# Patient Record
Sex: Male | Born: 1980 | Race: Black or African American | Hispanic: No | Marital: Married | Smoking: Current every day smoker
Health system: Southern US, Community
[De-identification: ages and names within clinical notes are randomized; demographics above are authoritative.]

## PROBLEM LIST (undated history)

## (undated) DIAGNOSIS — E119 Type 2 diabetes mellitus without complications: Secondary | ICD-10-CM

---

## 2004-12-13 ENCOUNTER — Emergency Department: Payer: Self-pay | Admitting: Internal Medicine

## 2005-09-24 ENCOUNTER — Emergency Department: Payer: Self-pay | Admitting: Unknown Physician Specialty

## 2007-04-03 ENCOUNTER — Emergency Department: Payer: Self-pay | Admitting: Emergency Medicine

## 2008-09-29 ENCOUNTER — Emergency Department: Payer: Self-pay | Admitting: Emergency Medicine

## 2010-10-11 ENCOUNTER — Emergency Department: Payer: Self-pay | Admitting: Emergency Medicine

## 2010-11-09 ENCOUNTER — Emergency Department: Payer: Self-pay | Admitting: Emergency Medicine

## 2014-01-30 ENCOUNTER — Emergency Department: Payer: Self-pay | Admitting: Emergency Medicine

## 2014-02-01 ENCOUNTER — Emergency Department: Payer: Self-pay | Admitting: Emergency Medicine

## 2015-04-29 ENCOUNTER — Encounter: Payer: Self-pay | Admitting: Emergency Medicine

## 2015-04-29 ENCOUNTER — Emergency Department
Admission: EM | Admit: 2015-04-29 | Discharge: 2015-04-29 | Disposition: A | Discharge status: Home / Self Care | Payer: BLUE CROSS/BLUE SHIELD | Attending: Emergency Medicine | Admitting: Emergency Medicine

## 2015-04-29 DIAGNOSIS — M62838 Other muscle spasm: Secondary | ICD-10-CM | POA: Insufficient documentation

## 2015-04-29 DIAGNOSIS — Y990 Civilian activity done for income or pay: Secondary | ICD-10-CM | POA: Diagnosis not present

## 2015-04-29 DIAGNOSIS — Y93F2 Activity, caregiving, lifting: Secondary | ICD-10-CM | POA: Insufficient documentation

## 2015-04-29 DIAGNOSIS — M5442 Lumbago with sciatica, left side: Secondary | ICD-10-CM | POA: Diagnosis not present

## 2015-04-29 DIAGNOSIS — S3992XA Unspecified injury of lower back, initial encounter: Secondary | ICD-10-CM | POA: Diagnosis present

## 2015-04-29 DIAGNOSIS — X501XXA Overexertion from prolonged static or awkward postures, initial encounter: Secondary | ICD-10-CM | POA: Insufficient documentation

## 2015-04-29 DIAGNOSIS — F17200 Nicotine dependence, unspecified, uncomplicated: Secondary | ICD-10-CM | POA: Insufficient documentation

## 2015-04-29 DIAGNOSIS — M62830 Muscle spasm of back: Secondary | ICD-10-CM

## 2015-04-29 DIAGNOSIS — Y9289 Other specified places as the place of occurrence of the external cause: Secondary | ICD-10-CM | POA: Insufficient documentation

## 2015-04-29 MED ORDER — MELOXICAM 15 MG PO TABS: 15.0000 mg | ORAL_TABLET | Freq: Every day | ORAL | Status: DC

## 2015-04-29 MED ORDER — CYCLOBENZAPRINE HCL 10 MG PO TABS: 10.0000 mg | ORAL_TABLET | Freq: Three times a day (TID) | ORAL | Status: DC | PRN

## 2015-04-29 NOTE — ED Notes (Signed)
Pt states injured low back approx 2 weeks pta. Pt states pain is worse this am, and extends down posterior legs. Pt states has had intermittent sensation of left great toe numbness. Pt ambulatory without difficulty. resps unlabored.

## 2015-04-29 NOTE — ED Provider Notes (Signed)
[facility] Emergency Department Provider Note ?  ? ____________________________________________ ? Time seen: 7:26 AM ? I have reviewed the triage vital signs and the nursing notes.  ________ HISTORY ? Chief Complaint Back Pain     HPI  Bryan Gray is a 35 y.o. male   who presents emergency department complaining of left-sided lower back pain. He states that he was at work approximately 2 weeks ago when he lifted something wrong and developed some lower back pain. He states that over the intervening. The pain has increased to include some radiation down left leg and intermittent left great toe numbness. Patient denies any urinary symptoms or complaints. He denies any saddle anesthesia or bowel or bladder dysfunction. Patient states he is tried some intermittent use of Tylenol with minimal relief. She states that pain is constant but does wax and wane in severity, is a 8/burning sensation. ? ? ? History reviewed. No pertinent past medical history.  There are no active problems to display for this patient.  ? History reviewed. No pertinent past surgical history. ? Current Outpatient Rx  Name  Route  Sig  Dispense  Refill  . cyclobenzaprine (FLEXERIL) 10 MG tablet   Oral   Take 1 tablet (10 mg total) by mouth 3 (three) times daily as needed for muscle spasms.   15 tablet   0   . meloxicam (MOBIC) 15 MG tablet   Oral   Take 1 tablet (15 mg total) by mouth daily.   30 tablet   0    ? Allergies Review of patient's allergies indicates no known allergies. ? No family history on file. ? Social History Social History  Substance Use Topics  . Smoking status: Current Every Day Smoker  . Smokeless tobacco: Never Used  . Alcohol Use: No   ? Review of Systems Constitutional: no fever. Eyes: no discharge ENT: no sore throat. Cardiovascular: no chest pain. Respiratory: no cough. No sob Gastrointestinal: denies abdominal pain, vomiting,  diarrhea, and constipation Genitourinary: no dysuria. Negative for hematuria Musculoskeletal: Positive for back pain.. Skin: Negative for rash. Neurological: Negative for headaches  10-point ROS otherwise negative.  _______________ PHYSICAL EXAM: ? VITAL SIGNS:   ED Triage Vitals  Enc Vitals Group     BP 04/29/15 0633 133/75 mmHg     Pulse Rate 04/29/15 0633 92     Resp 04/29/15 0633 16     Temp 04/29/15 0633 98.1 F (36.7 C)     Temp Source 04/29/15 0633 Oral     SpO2 04/29/15 0633 99 %     Weight 04/29/15 0633 265 lb (120.203 kg)     Height 04/29/15 0633 6' (1.829 m)     Head Cir --      Peak Flow --      Pain Score 04/29/15 0634 8     Pain Loc --      Pain Edu? --      Excl. in [place]? --    ?  Constitutional: Alert and oriented. Well appearing and in no distress. Eyes: Conjunctivae are normal.  ENT      Head: Normocephalic and atraumatic.      Ears:       Nose: No congestion/rhinnorhea.      Mouth/Throat: Mucous membranes are moist.   Hematological/Lymphatic/Immunilogical: No cervical lymphadenopathy. Cardiovascular: Normal rate, regular rhythm. Normal S1 and S2. Respiratory: Normal respiratory effort without tachypnea nor retractions. Lungs CTAB. Gastrointestinal: Soft and nontender. No distention. There is no CVA  tenderness. Genitourinary:  Musculoskeletal: Nontender with normal range of motion in all extremities. No visible deformity to lower spine upon inspection. Patient is nontender to palpation midline spinal processes. Patient is tender to palpation diffusely over the paraspinal muscle groups in the left lumbar and sacral regions. Minor spasms are noted to palpation. Patient is tender to palpation over the sciatic notch. Positive straight leg raise on the left side. Dorsalis pedis pulses palpated bilaterally. Sensation is present and equal in distal lower extremities.  Neurologic:  Normal speech and language. No gross focal neurologic deficits are  appreciated. Skin:  Skin is warm, dry and intact. No rash noted. Psychiatric: Mood and affect are normal. Speech and behavior are normal. Patient exhibits appropriate insight and judgment.    LABS (all labs ordered are listed, but only abnormal results are displayed)  Labs Reviewed - No data to display  ___________ RADIOLOGY    _____________ PROCEDURES ? Procedure(s) performed:    Medications - No data to display  ______________________________________________________ INITIAL IMPRESSION / ASSESSMENT AND PLAN / ED COURSE ? Pertinent labs & imaging results that were available during my care of the patient were reviewed by me and considered in my medical decision making (see chart for details).    Should symptoms are consistent with lumbago with radicular left sided symptoms. Patient will be placed on muscle relaxers and anti-inflammatories for symptom control. The patient will follow-up with orthopedics should symptoms persist past treatment course. Patient will return to the emergency department for any sudden increase of pain or worsening of symptoms.    New Prescriptions   CYCLOBENZAPRINE (FLEXERIL) 10 MG TABLET    Take 1 tablet (10 mg total) by mouth 3 (three) times daily as needed for muscle spasms.   MELOXICAM (MOBIC) 15 MG TABLET    Take 1 tablet (15 mg total) by mouth daily.   ____________________________________________ FINAL CLINICAL IMPRESSION(S) / ED DIAGNOSES?  Final diagnoses:  Acute left-sided low back pain with left-sided sciatica  Paraspinal muscle spasm             Darletta Moll, PA-C 04/29/15 [id number]  Daymon Larsen, MD 04/29/15 [phone]

## 2015-04-29 NOTE — Discharge Instructions (Signed)
Back Exercises The following exercises strengthen the muscles that help to support the back. They also help to keep the lower back flexible. Doing these exercises can help to prevent back pain or lessen existing pain. If you have back pain or discomfort, try doing these exercises 2-3 times each day or as told by your health care provider. When the pain goes away, do them once each day, but increase the number of times that you repeat the steps for each exercise (do more repetitions). If you do not have back pain or discomfort, do these exercises once each day or as told by your health care provider. EXERCISES Single Knee to Chest Repeat these steps 3-5 times for each leg:  Lie on your back on a firm bed or the floor with your legs extended.  Bring one knee to your chest. Your other leg should stay extended and in contact with the floor.  Hold your knee in place by grabbing your knee or thigh.  Pull on your knee until you feel a gentle stretch in your lower back.  Hold the stretch for 10-30 seconds.  Slowly release and straighten your leg. Pelvic Tilt Repeat these steps 5-10 times:  Lie on your back on a firm bed or the floor with your legs extended.  Bend your knees so they are pointing toward the ceiling and your feet are flat on the floor.  Tighten your lower abdominal muscles to press your lower back against the floor. This motion will tilt your pelvis so your tailbone points up toward the ceiling instead of pointing to your feet or the floor.  With gentle tension and even breathing, hold this position for 5-10 seconds. Cat-Cow Repeat these steps until your lower back becomes more flexible:  Get into a hands-and-knees position on a firm surface. Keep your hands under your shoulders, and keep your knees under your hips. You may place padding under your knees for comfort.  Let your head hang down, and point your tailbone toward the floor so your lower back becomes rounded like the  back of a cat.  Hold this position for 5 seconds.  Slowly lift your head and point your tailbone up toward the ceiling so your back forms a sagging arch like the back of a cow.  Hold this position for 5 seconds. Press-Ups Repeat these steps 5-10 times:  Lie on your abdomen (face-down) on the floor.  Place your palms near your head, about shoulder-width apart.  While you keep your back as relaxed as possible and keep your hips on the floor, slowly straighten your arms to raise the top half of your body and lift your shoulders. Do not use your back muscles to raise your upper torso. You may adjust the placement of your hands to make yourself more comfortable.  Hold this position for 5 seconds while you keep your back relaxed.  Slowly return to lying flat on the floor. Bridges Repeat these steps 10 times:  Lie on your back on a firm surface.  Bend your knees so they are pointing toward the ceiling and your feet are flat on the floor.  Tighten your buttocks muscles and lift your buttocks off of the floor until your waist is at almost the same height as your knees. You should feel the muscles working in your buttocks and the back of your thighs. If you do not feel these muscles, slide your feet 1-2 inches farther away from your buttocks.  Hold this position for 3-5  seconds.  Slowly lower your hips to the starting position, and allow your buttocks muscles to relax completely. If this exercise is too easy, try doing it with your arms crossed over your chest. Abdominal Crunches Repeat these steps 5-10 times:  Lie on your back on a firm bed or the floor with your legs extended.  Bend your knees so they are pointing toward the ceiling and your feet are flat on the floor.  Cross your arms over your chest.  Tip your chin slightly toward your chest without bending your neck.  Tighten your abdominal muscles and slowly raise your trunk (torso) high enough to lift your shoulder blades a  tiny bit off of the floor. Avoid raising your torso higher than that, because it can put too much stress on your low back and it does not help to strengthen your abdominal muscles.  Slowly return to your starting position. Back Lifts Repeat these steps 5-10 times: 1. Lie on your abdomen (face-down) with your arms at your sides, and rest your forehead on the floor. 2. Tighten the muscles in your legs and your buttocks. 3. Slowly lift your chest off of the floor while you keep your hips pressed to the floor. Keep the back of your head in line with the curve in your back. Your eyes should be looking at the floor. 4. Hold this position for 3-5 seconds. 5. Slowly return to your starting position. SEEK MEDICAL CARE IF:  Your back pain or discomfort gets much worse when you do an exercise.  Your back pain or discomfort does not lessen within 2 hours after you exercise. If you have any of these problems, stop doing these exercises right away. Do not do them again unless your health care provider says that you can. SEEK IMMEDIATE MEDICAL CARE IF:  You develop sudden, severe back pain. If this happens, stop doing the exercises right away. Do not do them again unless your health care provider says that you can.   This information is not intended to replace advice given to you by your health care provider. Make sure you discuss any questions you have with your health care provider.   Document Released: 05/22/2004 Document Revised: 01/03/2015 Document Reviewed: 06/08/2014 Elsevier Interactive Patient Education 2016 [place] Injury Prevention Back injuries can be very painful. They can also be difficult to heal. After having one back injury, you are more likely to injure your back again. It is important to learn how to avoid injuring or re-injuring your back. The following tips can help you to prevent a back injury. WHAT SHOULD I KNOW ABOUT PHYSICAL FITNESS?  Exercise for 30 minutes per  day on most days of the week or as directed by your health care provider. Make sure to:  Do aerobic exercises, such as walking, jogging, biking, or swimming.  Do exercises that increase balance and strength, such as tai chi and yoga. These can decrease your risk of falling and injuring your back.  Do stretching exercises to help with flexibility.  Try to develop strong abdominal muscles. Your abdominal muscles provide a lot of the support that is needed by your back.  Maintain a healthy weight. This helps to decrease your risk of a back injury. WHAT SHOULD I KNOW ABOUT MY DIET?  Talk with your health care provider about your overall diet. Take supplements and vitamins only as directed by your health care provider.  Talk with your health care provider about how much calcium and  D you need each day. These nutrients help to prevent weakening of the bones (osteoporosis). Osteoporosis can cause broken (fractured) bones, which lead to back pain.  Include good sources of calcium in your diet, such as dairy products, green leafy vegetables, and products that have had calcium added to them (fortified).  Include good sources of vitamin D in your diet, such as milk and foods that are fortified with vitamin D. WHAT SHOULD I KNOW ABOUT MY POSTURE?  Sit up straight and stand up straight. Avoid leaning forward when you sit or hunching over when you stand.  Choose chairs that have good low-back (lumbar) support.  If you work at a desk, sit close to it so you do not need to lean over. Keep your chin tucked in. Keep your neck drawn back, and keep your elbows bent at a right angle. Your arms should look like the letter "L."  Sit high and close to the steering wheel when you drive. Add a lumbar support to your car seat, if needed.  Avoid sitting or standing in one position for very long. Take breaks to get up, stretch, and walk around at least one time every hour. Take breaks every hour if you are  driving for long periods of time.  Sleep on your side with your knees slightly bent, or sleep on your back with a pillow under your knees. Do not lie on the front of your body to sleep. WHAT SHOULD I KNOW ABOUT LIFTING, TWISTING, AND REACHING? Lifting and Heavy Lifting  Avoid heavy lifting, especially repetitive heavy lifting. If you must do heavy lifting:  Stretch before lifting.  Work slowly.  Rest between lifts.  Use a tool such as a cart or a dolly to move objects if one is available.  Make several small trips instead of carrying one heavy load.  Ask for help when you need it, especially when moving big objects.  Follow these steps when lifting:  Stand with your feet shoulder-width apart.  Get as close to the object as you can. Do not try to pick up a heavy object that is far from your body.  Use handles or lifting straps if they are available.  Bend at your knees. Squat down, but keep your heels off the floor.  Keep your shoulders pulled back, your chin tucked in, and your back straight.  Lift the object slowly while you tighten the muscles in your legs, abdomen, and buttocks. Keep the object as close to the center of your body as possible.  Follow these steps when putting down a heavy load:  Stand with your feet shoulder-width apart.  Lower the object slowly while you tighten the muscles in your legs, abdomen, and buttocks. Keep the object as close to the center of your body as possible.  Keep your shoulders pulled back, your chin tucked in, and your back straight.  Bend at your knees. Squat down, but keep your heels off the floor.  Use handles or lifting straps if they are available. Twisting and Reaching  Avoid lifting heavy objects above your waist.  Do not twist at your waist while you are lifting or carrying a load. If you need to turn, move your feet.  Do not bend over without bending at your knees.  Avoid reaching over your head, across a table, or  for an object on a high surface. WHAT ARE SOME OTHER TIPS?  Avoid wet floors and icy ground. Keep sidewalks clear of ice to prevent falls.    falls.  Do not sleep on a mattress that is too soft or too hard.  Keep items that are used frequently within easy reach.  Put heavier objects on shelves at waist level, and put lighter objects on lower or higher shelves.  Find ways to decrease your stress, such as exercise, massage, or relaxation techniques. Stress can build up in your muscles. Tense muscles are more vulnerable to injury.  Talk with your health care provider if you feel anxious or depressed. These conditions can make back pain worse.  Wear flat heel shoes with cushioned soles.  Avoid sudden movements.  Use both shoulder straps when carrying a backpack.  Do not use any tobacco products, including cigarettes, chewing tobacco, or electronic cigarettes. If you need help quitting, ask your health care provider.   This information is not intended to replace advice given to you by your health care provider. Make sure you discuss any questions you have with your health care provider.   Document Released: 05/22/2004 Document Revised: 08/29/2014 Document Reviewed: 04/18/2014 Elsevier Interactive Patient Education 2016 [employer]  Radicular Pain Radicular pain in either the arm or leg is usually from a bulging or herniated disk in the spine. A piece of the herniated disk may press against the nerves as the nerves exit the spine. This causes pain which is felt at the tips of the nerves down the arm or leg. Other causes of radicular pain may include:  Fractures.  Heart disease.  Cancer.  An abnormal and usually degenerative state of the nervous system or nerves (neuropathy). Diagnosis may require CT or MRI scanning to determine the primary cause.  Nerves that start at the neck (nerve roots) may cause radicular pain in the outer shoulder and arm. It can spread down to the thumb and fingers.  The symptoms vary depending on which nerve root has been affected. In most cases radicular pain improves with conservative treatment. Neck problems may require physical therapy, a neck collar, or cervical traction. Treatment may take many weeks, and surgery may be considered if the symptoms do not improve.  Conservative treatment is also recommended for sciatica. Sciatica causes pain to radiate from the lower back or buttock area down the leg into the foot. Often there is a history of back problems. Most patients with sciatica are better after 2 to 4 weeks of rest and other supportive care. Short term bed rest can reduce the disk pressure considerably. Sitting, however, is not a good position since this increases the pressure on the disk. You should avoid bending, lifting, and all other activities which make the problem worse. Traction can be used in severe cases. Surgery is usually reserved for patients who do not improve within the first months of treatment. Only take over-the-counter or prescription medicines for pain, discomfort, or fever as directed by your caregiver. Narcotics and muscle relaxants may help by relieving more severe pain and spasm and by providing mild sedation. Cold or massage can give significant relief. Spinal manipulation is not recommended. It can increase the degree of disc protrusion. Epidural steroid injections are often effective treatment for radicular pain. These injections deliver medicine to the spinal nerve in the space between the protective covering of the spinal cord and back bones (vertebrae). Your caregiver can give you more information about steroid injections. These injections are most effective when given within two weeks of the onset of pain.  You should see your caregiver for follow up care as recommended. A program for neck and  back injury rehabilitation with stretching and strengthening exercises is an important part of management.  SEEK IMMEDIATE MEDICAL CARE  IF:  You develop increased pain, weakness, or numbness in your arm or leg.  You develop difficulty with bladder or bowel control.  You develop abdominal pain.   This information is not intended to replace advice given to you by your health care provider. Make sure you discuss any questions you have with your health care provider.   Document Released: 05/22/2004 Document Revised: 05/05/2014 Document Reviewed: 11/08/2014 Elsevier Interactive Patient Education [employer].

## 2015-04-29 NOTE — ED Notes (Signed)
Back pain started about 2 weeks ago in lower back that radiates down to left leg. No history of pain like this before. Pt states he was at work and picked up something, but pain got worse with time.

## 2015-05-09 ENCOUNTER — Emergency Department (HOSPITAL_COMMUNITY)
Admission: EM | Admit: 2015-05-09 | Discharge: 2015-05-09 | Disposition: A | Discharge status: Home / Self Care | Payer: BLUE CROSS/BLUE SHIELD | Attending: Emergency Medicine | Admitting: Emergency Medicine

## 2015-05-09 ENCOUNTER — Encounter (HOSPITAL_COMMUNITY): Payer: Self-pay | Admitting: *Deleted

## 2015-05-09 DIAGNOSIS — M79605 Pain in left leg: Secondary | ICD-10-CM | POA: Diagnosis present

## 2015-05-09 DIAGNOSIS — R350 Frequency of micturition: Secondary | ICD-10-CM | POA: Diagnosis not present

## 2015-05-09 DIAGNOSIS — F17200 Nicotine dependence, unspecified, uncomplicated: Secondary | ICD-10-CM | POA: Insufficient documentation

## 2015-05-09 DIAGNOSIS — M5442 Lumbago with sciatica, left side: Secondary | ICD-10-CM | POA: Diagnosis not present

## 2015-05-09 DIAGNOSIS — E119 Type 2 diabetes mellitus without complications: Secondary | ICD-10-CM | POA: Diagnosis not present

## 2015-05-09 DIAGNOSIS — Z113 Encounter for screening for infections with a predominantly sexual mode of transmission: Secondary | ICD-10-CM | POA: Insufficient documentation

## 2015-05-09 DIAGNOSIS — Z711 Person with feared health complaint in whom no diagnosis is made: Secondary | ICD-10-CM

## 2015-05-09 DIAGNOSIS — R300 Dysuria: Secondary | ICD-10-CM | POA: Diagnosis not present

## 2015-05-09 HISTORY — DX: Type 2 diabetes mellitus without complications: E11.9

## 2015-05-09 LAB — CBG MONITORING, ED: Glucose-Capillary: 232 mg/dL — ABNORMAL HIGH (ref 65–99)

## 2015-05-09 LAB — URINALYSIS, ROUTINE W REFLEX MICROSCOPIC
Bilirubin Urine: NEGATIVE
Hgb urine dipstick: NEGATIVE
KETONES UR: NEGATIVE mg/dL
Nitrite: NEGATIVE
PH: 6 (ref 5.0–8.0)
Protein, ur: NEGATIVE mg/dL

## 2015-05-09 LAB — URINE MICROSCOPIC-ADD ON

## 2015-05-09 MED ORDER — METHOCARBAMOL 500 MG PO TABS: 500.0000 mg | ORAL_TABLET | Freq: Two times a day (BID) | ORAL | Status: DC | PRN

## 2015-05-09 MED ORDER — AZITHROMYCIN 250 MG PO TABS
1000.0000 mg | ORAL_TABLET | Freq: Once | ORAL | Status: AC
  Administered 2015-05-09: 1000 mg via ORAL
  Filled 2015-05-09: qty 4

## 2015-05-09 MED ORDER — ACETAMINOPHEN 325 MG PO TABS
650.0000 mg | ORAL_TABLET | Freq: Once | ORAL | Status: AC
  Administered 2015-05-09: 650 mg via ORAL
  Filled 2015-05-09: qty 2

## 2015-05-09 MED ORDER — NAPROXEN 250 MG PO TABS: 250.0000 mg | ORAL_TABLET | Freq: Two times a day (BID) | ORAL | Status: DC

## 2015-05-09 MED ORDER — CEFTRIAXONE SODIUM 250 MG IJ SOLR
250.0000 mg | Freq: Once | INTRAMUSCULAR | Status: AC
  Administered 2015-05-09: 250 mg via INTRAMUSCULAR
  Filled 2015-05-09: qty 250

## 2015-05-09 MED ORDER — STERILE WATER FOR INJECTION IJ SOLN
10.0000 mL | Freq: Once | INTRAMUSCULAR | Status: AC
  Administered 2015-05-09: 10 mL via INTRAMUSCULAR
  Filled 2015-05-09: qty 10

## 2015-05-09 NOTE — ED Provider Notes (Signed)
CSN: [id number]     Arrival date & time 05/09/15  1116 History  By signing my name below, I, [name], attest that this documentation has been prepared under the direction and in the presence of Will Kelon Easom, PA-C. Electronically Signed: Judithann Sauger, ED Scribe. 05/09/2015. 2:39 PM.    Chief Complaint  Patient presents with  . Leg Pain   The history is provided by the patient. No language interpreter was used.   HPI Comments: Bryan Gray is a 35 y.o. male with a hx of DM who presents to the Emergency Department complaining gradually worsening 8/10 pain that radiates down his left buttock behind his left leg onset one month ago. He explains that his symptoms started out as back pain after lifting something heavy but that pain has since resolved. He reports associated tingling in toes intermittently. He also reports associated urinary frequency and dysuria onset one week ago. No alleviating factors noted. 8/10 pain. Pt has not taken any medication for his symptoms. He denies any recent falls or trauma. He also denies any IV drug use. Pt reports that he has Metformin at home but has been taking it intermittently as needed.  He denies any fever, chills, leg swelling, gait problems, bladder/bowel incontinence, hematuria, penile discharge, penile/testicular pain, or scrotal swelling.    Past Medical History  Diagnosis Date  . Diabetes mellitus without complication ([place])    History reviewed. No pertinent past surgical history. History reviewed. No pertinent family history. Social History  Substance Use Topics  . Smoking status: Current Every Day Smoker  . Smokeless tobacco: Never Used  . Alcohol Use: No    Review of Systems  Constitutional: Negative for fever and chills.  Respiratory: Negative for cough.   Cardiovascular: Negative for leg swelling.  Gastrointestinal: Negative for vomiting, abdominal pain and diarrhea.  Genitourinary: Positive for dysuria and frequency.  Negative for urgency, hematuria, decreased urine volume, discharge, scrotal swelling, difficulty urinating, penile pain and testicular pain.  Skin: Negative for rash.  Neurological: Negative for weakness, light-headedness, numbness and headaches.      Allergies  Review of patient's allergies indicates no known allergies.  Home Medications   Prior to Admission medications   Medication Sig Start Date End Date Taking? Authorizing Provider  methocarbamol (ROBAXIN) 500 MG tablet Take 1 tablet (500 mg total) by mouth 2 (two) times daily as needed for muscle spasms. 05/09/15   Waynetta Pean, PA-C  naproxen (NAPROSYN) 250 MG tablet Take 1 tablet (250 mg total) by mouth 2 (two) times daily with a meal. 05/09/15   Waynetta Pean, PA-C   BP 117/72 mmHg  Pulse 88  Temp(Src) 98 F (36.7 C) (Oral)  Resp 18  SpO2 99% Physical Exam  Constitutional: He appears well-developed and well-nourished. No distress.  HENT:  Head: Normocephalic and atraumatic.  Eyes: Conjunctivae are normal. Pupils are equal, round, and reactive to light. Right eye exhibits no discharge. Left eye exhibits no discharge.  Neck: Neck supple.  Cardiovascular: Normal rate, regular rhythm, normal heart sounds and intact distal pulses.   Bilateral posterior tibialis and dorsalis pedis pulses are intact.    Pulmonary/Chest: Effort normal and breath sounds normal. No respiratory distress. He has no wheezes. He has no rales.  Abdominal: Soft. Bowel sounds are normal. He exhibits no distension. There is no tenderness. There is no guarding.  No CVA or flank tenderness.   Musculoskeletal: Normal range of motion. He exhibits tenderness. He exhibits no edema.  No midline back tenderness, but  there is tenderness over the left buttock. No edema or crepitus.  No calf edema or tenderness.  No midline neck tenderness. Good strength with plantar and dorsiflexion bilaterally. 5/5 strength bilateral LE.  Normal gait.   Lymphadenopathy:    He  has no cervical adenopathy.  Neurological: He is alert. He has normal reflexes. He displays normal reflexes. Coordination normal.  Normal gait.  Sensation is intact to his bilateral lower extremities. Bilateral patellar DTRs are intact.  Skin: Skin is warm and dry. No rash noted. He is not diaphoretic.  Psychiatric: He has a normal mood and affect. His behavior is normal.  Nursing note and vitals reviewed.   ED Course  Procedures (including critical care time) DIAGNOSTIC STUDIES: Oxygen Saturation is 99% on RA, normal by my interpretation.    COORDINATION OF CARE: 1:26 PM- Pt advised of plan for treatment and pt agrees. Pt will provide urine sample for further evaluation of urinary symptoms. He will receive 650 mg Tylenol.    Labs Review Labs Reviewed  URINALYSIS, ROUTINE W REFLEX MICROSCOPIC (NOT AT [facility]) - Abnormal; Notable for the following:    APPearance CLOUDY (*)    Specific Gravity, Urine >1.046 (*)    Glucose, UA >1000 (*)    Leukocytes, UA SMALL (*)    All other components within normal limits  URINE MICROSCOPIC-ADD ON - Abnormal; Notable for the following:    Squamous Epithelial / LPF 0-5 (*)    Bacteria, UA RARE (*)    All other components within normal limits  CBG MONITORING, ED - Abnormal; Notable for the following:    Glucose-Capillary 232 (*)    All other components within normal limits  URINE CULTURE  RPR  HIV ANTIBODY (ROUTINE TESTING)  GC/CHLAMYDIA PROBE AMP ([place]) NOT AT [facility]    Waynetta Pean, PA-C has personally reviewed and evaluated these lab results as part of his medical decision-making.  MDM   Meds given in ED:  Medications  acetaminophen (TYLENOL) tablet 650 mg (650 mg Oral Given 05/09/15 1349)  cefTRIAXone (ROCEPHIN) injection 250 mg (250 mg Intramuscular Given 05/09/15 1501)  azithromycin (ZITHROMAX) tablet 1,000 mg (1,000 mg Oral Given 05/09/15 1501)  sterile water (preservative free) injection 10 mL (10 mLs Injection Given 05/09/15  1501)    Discharge Medication List as of 05/09/2015  3:23 PM    START taking these medications   Details  methocarbamol (ROBAXIN) 500 MG tablet Take 1 tablet (500 mg total) by mouth 2 (two) times daily as needed for muscle spasms., Starting 05/09/2015, Until Discontinued, Print    naproxen (NAPROSYN) 250 MG tablet Take 1 tablet (250 mg total) by mouth 2 (two) times daily with a meal., Starting 05/09/2015, Until Discontinued, Print        Final diagnoses:  Left-sided low back pain with left-sided sciatica  Concern about STD in male without diagnosis   This is a 35 y.o. male with a hx of DM who presents to the Emergency Department complaining gradually worsening 8/10 pain that radiates down his left buttock behind his left leg onset one month ago. He explains that his symptoms started out as back pain after lifting something heavy but that pain has since resolved. He reports associated tingling in toes intermittently. He also reports associated urinary frequency and dysuria onset one week ago. No alleviating factors noted.  On exam patient is afebrile nontoxic appearing. His abdomen is soft nontender palpation. Is no CVA or flank tenderness. He has no midline neck or back tenderness.  He has some mild tenderness to his left lower back and upper buttocks. He has no focal neurological deficits. No concern for cauda equina.  Urinalysis is nitrate negative and shows small leukocytes. Has 0-5 white blood cells. Rare bacteria. Urine sent for culture. I doubt urinary tract infection but I question STD. Patient also has greater than 1000 glucose in his urine. On further question patient admits that he has diabetes and intermittently takes metformin. He reports his plenty of metformin at home but has been forgetting to take it. Finger stick glucose is 232. I advised the patient of these findings. He also notes being sexually active and denies and protection. I'm concerned [place]. Will treat with Rocephin and  azithromycin and check for STDs. I advised the patient that his RPR, HIV, gonorrhea and chlamydia testing are in process and he should follow-up with his test results. We'll discharge a prescription for naproxen and Robaxin. Advised back exercises to do for his back pain. I doubt his dysuria is related to his back pain. I advised the patient to follow-up with their primary care provider this week. I advised the patient to return to the emergency department with new or worsening symptoms or new concerns. The patient verbalized understanding and agreement with plan.    This patient was discussed with Dr. Reather Converse who agrees with assessment and plan.   I personally performed the services described in this documentation, which was scribed in my presence. The recorded information has been reviewed and is accurate.      Waynetta Pean, PA-C 05/09/15 [place], MD 05/12/15 [phone]

## 2015-05-09 NOTE — ED Notes (Signed)
Declined W/C at D/C and was escorted to lobby by RN. 

## 2015-05-09 NOTE — ED Notes (Signed)
eval for elevated BS

## 2015-05-09 NOTE — Discharge Instructions (Signed)
Back Exercises The following exercises strengthen the muscles that help to support the back. They also help to keep the lower back flexible. Doing these exercises can help to prevent back pain or lessen existing pain. If you have back pain or discomfort, try doing these exercises 2-3 times each day or as told by your health care provider. When the pain goes away, do them once each day, but increase the number of times that you repeat the steps for each exercise (do more repetitions). If you do not have back pain or discomfort, do these exercises once each day or as told by your health care provider. EXERCISES Single Knee to Chest Repeat these steps 3-5 times for each leg:  Lie on your back on a firm bed or the floor with your legs extended.  Bring one knee to your chest. Your other leg should stay extended and in contact with the floor.  Hold your knee in place by grabbing your knee or thigh.  Pull on your knee until you feel a gentle stretch in your lower back.  Hold the stretch for 10-30 seconds.  Slowly release and straighten your leg. Pelvic Tilt Repeat these steps 5-10 times:  Lie on your back on a firm bed or the floor with your legs extended.  Bend your knees so they are pointing toward the ceiling and your feet are flat on the floor.  Tighten your lower abdominal muscles to press your lower back against the floor. This motion will tilt your pelvis so your tailbone points up toward the ceiling instead of pointing to your feet or the floor.  With gentle tension and even breathing, hold this position for 5-10 seconds. Cat-Cow Repeat these steps until your lower back becomes more flexible:  Get into a hands-and-knees position on a firm surface. Keep your hands under your shoulders, and keep your knees under your hips. You may place padding under your knees for comfort.  Let your head hang down, and point your tailbone toward the floor so your lower back becomes rounded like the  back of a cat.  Hold this position for 5 seconds.  Slowly lift your head and point your tailbone up toward the ceiling so your back forms a sagging arch like the back of a cow.  Hold this position for 5 seconds. Press-Ups Repeat these steps 5-10 times:  Lie on your abdomen (face-down) on the floor.  Place your palms near your head, about shoulder-width apart.  While you keep your back as relaxed as possible and keep your hips on the floor, slowly straighten your arms to raise the top half of your body and lift your shoulders. Do not use your back muscles to raise your upper torso. You may adjust the placement of your hands to make yourself more comfortable.  Hold this position for 5 seconds while you keep your back relaxed.  Slowly return to lying flat on the floor. Bridges Repeat these steps 10 times:  Lie on your back on a firm surface.  Bend your knees so they are pointing toward the ceiling and your feet are flat on the floor.  Tighten your buttocks muscles and lift your buttocks off of the floor until your waist is at almost the same height as your knees. You should feel the muscles working in your buttocks and the back of your thighs. If you do not feel these muscles, slide your feet 1-2 inches farther away from your buttocks.  Hold this position for 3-5  seconds.  Slowly lower your hips to the starting position, and allow your buttocks muscles to relax completely. If this exercise is too easy, try doing it with your arms crossed over your chest. Abdominal Crunches Repeat these steps 5-10 times:  Lie on your back on a firm bed or the floor with your legs extended.  Bend your knees so they are pointing toward the ceiling and your feet are flat on the floor.  Cross your arms over your chest.  Tip your chin slightly toward your chest without bending your neck.  Tighten your abdominal muscles and slowly raise your trunk (torso) high enough to lift your shoulder blades a  tiny bit off of the floor. Avoid raising your torso higher than that, because it can put too much stress on your low back and it does not help to strengthen your abdominal muscles.  Slowly return to your starting position. Back Lifts Repeat these steps 5-10 times:  Lie on your abdomen (face-down) with your arms at your sides, and rest your forehead on the floor.  Tighten the muscles in your legs and your buttocks.  Slowly lift your chest off of the floor while you keep your hips pressed to the floor. Keep the back of your head in line with the curve in your back. Your eyes should be looking at the floor.  Hold this position for 3-5 seconds.  Slowly return to your starting position. SEEK MEDICAL CARE IF:  Your back pain or discomfort gets much worse when you do an exercise.  Your back pain or discomfort does not lessen within 2 hours after you exercise. If you have any of these problems, stop doing these exercises right away. Do not do them again unless your health care provider says that you can. SEEK IMMEDIATE MEDICAL CARE IF:  You develop sudden, severe back pain. If this happens, stop doing the exercises right away. Do not do them again unless your health care provider says that you can.   This information is not intended to replace advice given to you by your health care provider. Make sure you discuss any questions you have with your health care provider.   Document Released: 05/22/2004 Document Revised: 01/03/2015 Document Reviewed: 06/08/2014 Elsevier Interactive Patient Education 2016 Elsevier Inc[employer] Back Pain, Adult Back pain is very common in adults.The cause of back pain is rarely dangerous and the pain often gets better over time.The cause of your back pain may not be known. Some common causes of back pain include:  Strain of the muscles or ligaments supporting the spine.  Wear and tear (degeneration) of the spinal disks.  Arthritis.  Direct injury to the back. For  many people, back pain may return. Since back pain is rarely dangerous, most people can learn to manage this condition on their own. HOME CARE INSTRUCTIONS Watch your back pain for any changes. The following actions may help to lessen any discomfort you are feeling:  Remain active. It is stressful on your back to sit or stand in one place for long periods of time. Do not sit, drive, or stand in one place for more than 30 minutes at a time. Take short walks on even surfaces as soon as you are able.Try to increase the length of time you walk each day.  Exercise regularly as directed by your health care provider. Exercise helps your back heal faster. It also helps avoid future injury by keeping your muscles strong and flexible.  Do not stay in bed.Resting  more than 1-2 days can delay your recovery.  Pay attention to your body when you bend and lift. The most comfortable positions are those that put less stress on your recovering back. Always use proper lifting techniques, including:  Bending your knees.  Keeping the load close to your body.  Avoiding twisting.  Find a comfortable position to sleep. Use a firm mattress and lie on your side with your knees slightly bent. If you lie on your back, put a pillow under your knees.  Avoid feeling anxious or stressed.Stress increases muscle tension and can worsen back pain.It is important to recognize when you are anxious or stressed and learn ways to manage it, such as with exercise.  Take medicines only as directed by your health care provider. Over-the-counter medicines to reduce pain and inflammation are often the most helpful.Your health care provider may prescribe muscle relaxant drugs.These medicines help dull your pain so you can more quickly return to your normal activities and healthy exercise.  Apply ice to the injured area:  Put ice in a plastic bag.  Place a towel between your skin and the bag.  Leave the ice on for 20 minutes,  2-3 times a day for the first 2-3 days. After that, ice and heat may be alternated to reduce pain and spasms.  Maintain a healthy weight. Excess weight puts extra stress on your back and makes it difficult to maintain good posture. SEEK MEDICAL CARE IF:  You have pain that is not relieved with rest or medicine.  You have increasing pain going down into the legs or buttocks.  You have pain that does not improve in one week.  You have night pain.  You lose weight.  You have a fever or chills. SEEK IMMEDIATE MEDICAL CARE IF:   You develop new bowel or bladder control problems.  You have unusual weakness or numbness in your arms or legs.  You develop nausea or vomiting.  You develop abdominal pain.  You feel faint.   This information is not intended to replace advice given to you by your health care provider. Make sure you discuss any questions you have with your health care provider.   Document Released: 04/14/2005 Document Revised: 05/05/2014 Document Reviewed: 08/16/2013 Elsevier Interactive Patient Education 2016 [employer]. Sexually Transmitted Disease A sexually transmitted disease (STD) is a disease or infection that may be passed (transmitted) from person to person, usually during sexual activity. This may happen by way of saliva, semen, blood, vaginal mucus, or urine. Common STDs include:  Gonorrhea.  Chlamydia.  Syphilis.  HIV and AIDS.  Genital herpes.  Hepatitis B and C.  Trichomonas.  Human papillomavirus (HPV).  Pubic lice.  Scabies.  Mites.  Bacterial vaginosis. WHAT ARE CAUSES OF STDs? An STD may be caused by bacteria, a virus, or parasites. STDs are often transmitted during sexual activity if one person is infected. However, they may also be transmitted through nonsexual means. STDs may be transmitted after:   Sexual intercourse with an infected person.  Sharing sex toys with an infected person.  Sharing needles with an infected person  or using unclean piercing or tattoo needles.  Having intimate contact with the genitals, mouth, or rectal areas of an infected person.  Exposure to infected fluids during birth. WHAT ARE THE SIGNS AND SYMPTOMS OF STDs? Different STDs have different symptoms. Some people may not have any symptoms. If symptoms are present, they may include:  Painful or bloody urination.  Pain in the pelvis,  abdomen, vagina, anus, throat, or eyes.  A skin rash, itching, or irritation.  Growths, ulcerations, blisters, or sores in the genital and anal areas.  Abnormal vaginal discharge with or without bad odor.  Penile discharge in men.  Fever.  Pain or bleeding during sexual intercourse.  Swollen glands in the groin area.  Yellow skin and eyes (jaundice). This is seen with hepatitis.  Swollen testicles.  Infertility.  Sores and blisters in the mouth. HOW ARE STDs DIAGNOSED? To make a diagnosis, your health care provider may:  Take a medical history.  Perform a physical exam.  Take a sample of any discharge to examine.  Swab the throat, cervix, opening to the penis, rectum, or vagina for testing.  Test a sample of your first morning urine.  Perform blood tests.  Perform a Pap test, if this applies.  Perform a colposcopy.  Perform a laparoscopy. HOW ARE STDs TREATED? Treatment depends on the STD. Some STDs may be treated but not cured.  Chlamydia, gonorrhea, trichomonas, and syphilis can be cured with antibiotic medicine.  Genital herpes, hepatitis, and HIV can be treated, but not cured, with prescribed medicines. The medicines lessen symptoms.  Genital warts from HPV can be treated with medicine or by freezing, burning (electrocautery), or surgery. Warts may come back.  HPV cannot be cured with medicine or surgery. However, abnormal areas may be removed from the cervix, vagina, or vulva.  If your diagnosis is confirmed, your recent sexual partners need treatment. This is  true even if they are symptom-free or have a negative culture or evaluation. They should not have sex until their health care providers say it is okay.  Your health care provider may test you for infection again 3 months after treatment. HOW CAN I REDUCE MY RISK OF GETTING AN STD? Take these steps to reduce your risk of getting an STD:  Use latex condoms, dental dams, and water-soluble lubricants during sexual activity. Do not use petroleum jelly or oils.  Avoid having multiple sex partners.  Do not have sex with someone who has other sex partners  Do not have sex with anyone you do not know or who is at high risk for an STD.  Avoid risky sex practices that can break your skin.  Do not have sex if you have open sores on your mouth or skin.  Avoid drinking too much alcohol or taking illegal drugs. Alcohol and drugs can affect your judgment and put you in a vulnerable position.  Avoid engaging in oral and anal sex acts.  Get vaccinated for HPV and hepatitis. If you have not received these vaccines in the past, talk to your health care provider about whether one or both might be right for you.  If you are at risk of being infected with HIV, it is recommended that you take a prescription medicine daily to prevent HIV infection. This is called pre-exposure prophylaxis (PrEP). You are considered at risk if:  You are a man who has sex with other men (MSM).  You are a heterosexual man or woman and are sexually active with more than one partner.  You take drugs by injection.  You are sexually active with a partner who has HIV.  Talk with your health care provider about whether you are at high risk of being infected with HIV. If you choose to begin PrEP, you should first be tested for HIV. You should then be tested every 3 months for as long as you are taking PrEP.  WHAT SHOULD I DO IF I THINK I HAVE AN STD?  See your health care provider.  Tell your sexual partner(s). They should be  tested and treated for any STDs.  Do not have sex until your health care provider says it is okay. WHEN SHOULD I GET IMMEDIATE MEDICAL CARE? Contact your health care provider right away if:   You have severe abdominal pain.  You are a man and notice swelling or pain in your testicles.  You are a woman and notice swelling or pain in your vagina.   This information is not intended to replace advice given to you by your health care provider. Make sure you discuss any questions you have with your health care provider.   Document Released: 07/05/2002 Document Revised: 05/05/2014 Document Reviewed: 11/02/2012 Elsevier Interactive Patient Education [employer].

## 2015-05-09 NOTE — ED Notes (Signed)
SEE PA assessment

## 2015-05-09 NOTE — ED Notes (Signed)
Pt states that he was seen recently for back pain. Pt states that pain in the back has improved however his hip continues to hurt.

## 2015-05-10 LAB — HIV ANTIBODY (ROUTINE TESTING W REFLEX): HIV Screen 4th Generation wRfx: NONREACTIVE

## 2015-05-10 LAB — URINE CULTURE

## 2015-05-10 LAB — RPR: RPR: NONREACTIVE

## 2015-05-10 LAB — GC/CHLAMYDIA PROBE AMP (~~LOC~~) NOT AT ARMC
Chlamydia: NEGATIVE
NEISSERIA GONORRHEA: NEGATIVE

## 2015-09-14 ENCOUNTER — Emergency Department
Admission: EM | Admit: 2015-09-14 | Discharge: 2015-09-14 | Disposition: A | Discharge status: Home / Self Care | Payer: BLUE CROSS/BLUE SHIELD | Attending: Emergency Medicine | Admitting: Emergency Medicine

## 2015-09-14 DIAGNOSIS — F17200 Nicotine dependence, unspecified, uncomplicated: Secondary | ICD-10-CM | POA: Insufficient documentation

## 2015-09-14 DIAGNOSIS — R739 Hyperglycemia, unspecified: Secondary | ICD-10-CM

## 2015-09-14 DIAGNOSIS — Z791 Long term (current) use of non-steroidal anti-inflammatories (NSAID): Secondary | ICD-10-CM | POA: Insufficient documentation

## 2015-09-14 DIAGNOSIS — L03011 Cellulitis of right finger: Secondary | ICD-10-CM | POA: Insufficient documentation

## 2015-09-14 DIAGNOSIS — E1165 Type 2 diabetes mellitus with hyperglycemia: Secondary | ICD-10-CM | POA: Insufficient documentation

## 2015-09-14 DIAGNOSIS — M79644 Pain in right finger(s): Secondary | ICD-10-CM | POA: Diagnosis present

## 2015-09-14 LAB — CBC WITH DIFFERENTIAL/PLATELET
Basophils Absolute: 0 10*3/uL (ref 0–0.1)
Eosinophils Absolute: 0.2 10*3/uL (ref 0–0.7)
Eosinophils Relative: 2 %
HEMATOCRIT: 45.3 % (ref 40.0–52.0)
HEMOGLOBIN: 15.2 g/dL (ref 13.0–18.0)
Lymphs Abs: 2.5 10*3/uL (ref 1.0–3.6)
MCH: 32.2 pg (ref 26.0–34.0)
MCHC: 33.5 g/dL (ref 32.0–36.0)
MCV: 96.1 fL (ref 80.0–100.0)
MONO ABS: 0.8 10*3/uL (ref 0.2–1.0)
NEUTROS ABS: 5.3 10*3/uL (ref 1.4–6.5)
Platelets: 189 10*3/uL (ref 150–440)
RBC: 4.71 MIL/uL (ref 4.40–5.90)
RDW: 12.2 % (ref 11.5–14.5)
WBC: 8.9 10*3/uL (ref 3.8–10.6)

## 2015-09-14 LAB — GLUCOSE, CAPILLARY: Glucose-Capillary: 428 mg/dL — ABNORMAL HIGH (ref 65–99)

## 2015-09-14 LAB — COMPREHENSIVE METABOLIC PANEL
ALBUMIN: 4 g/dL (ref 3.5–5.0)
ALK PHOS: 92 U/L (ref 38–126)
ALT: 22 U/L (ref 17–63)
AST: 20 U/L (ref 15–41)
Anion gap: 9 (ref 5–15)
BILIRUBIN TOTAL: 0.8 mg/dL (ref 0.3–1.2)
BUN: 9 mg/dL (ref 6–20)
CALCIUM: 8.7 mg/dL — AB (ref 8.9–10.3)
CO2: 22 mmol/L (ref 22–32)
CREATININE: 0.94 mg/dL (ref 0.61–1.24)
Chloride: 104 mmol/L (ref 101–111)
GFR calc Af Amer: >60 mL/min (ref >60)
GLUCOSE: 426 mg/dL — AB (ref 65–99)
POTASSIUM: 4 mmol/L (ref 3.5–5.1)
Sodium: 135 mmol/L (ref 135–145)
TOTAL PROTEIN: 7.2 g/dL (ref 6.5–8.1)

## 2015-09-14 MED ORDER — METFORMIN HCL 500 MG PO TABS: 500.0000 mg | ORAL_TABLET | Freq: Two times a day (BID) | ORAL | Status: DC

## 2015-09-14 MED ORDER — MUPIROCIN 2 % EX OINT: TOPICAL_OINTMENT | CUTANEOUS | Status: AC

## 2015-09-14 NOTE — ED Notes (Addendum)
PT c/o of pain/swelling of 5th finger on right hand at the nailbed. Pain radiated down hand and to fourth finger. Pt reports hx of Type II diabetes. Pt takes Metformin

## 2015-09-14 NOTE — ED Notes (Signed)
Pt POCT CBG 428. Pt reports he has not taken his Metformin since Tuesday morning

## 2015-09-14 NOTE — ED Notes (Signed)
Unable to obtain signature, due to signature pad not working. Pt given discharge instructions. Verbalized understanding.

## 2015-09-14 NOTE — ED Provider Notes (Signed)
[facility] Emergency Department Provider Note        Time seen: ----------------------------------------- 7:22 AM on 09/14/2015 -----------------------------------------    I have reviewed the triage vital signs and the nursing notes.   HISTORY  Chief Complaint Wound Infection    HPI Bryan Gray is a 35 y.o. male who presents to ER for pain and swelling around the nail of the fifth digit on the right hand. Patient states the pain radiated down his hand. He reports a history of diabetes but has not been taking his metformin recently. He does bite his fingernails, is an ingrown nail recently in that same finger   Past Medical History  Diagnosis Date  . Diabetes mellitus without complication ([place])     There are no active problems to display for this patient.   History reviewed. No pertinent past surgical history.  Allergies Review of patient's allergies indicates no known allergies.  Social History Social History  Substance Use Topics  . Smoking status: Current Every Day Smoker  . Smokeless tobacco: Never Used  . Alcohol Use: No    Review of Systems Constitutional: Negative for fever. Musculoskeletal: Positive for pain in the right fifth digit Skin: Positive for swelling and fluctuance noted around the right fifth digit distally Neurological: Negative for focal weakness or numbness.  ____________________________________________   PHYSICAL EXAM:  VITAL SIGNS: ED Triage Vitals  Enc Vitals Group     BP 09/14/15 0602 128/78 mmHg     Pulse Rate 09/14/15 0602 91     Resp 09/14/15 0602 18     Temp 09/14/15 0602 98.2 F (36.8 C)     Temp Source 09/14/15 0602 Oral     SpO2 09/14/15 0602 98 %     Weight 09/14/15 0602 255 lb (115.667 kg)     Height 09/14/15 0602 6' (1.829 m)     Head Cir --      Peak Flow --      Pain Score 09/14/15 0603 8     Pain Loc --      Pain Edu? --      Excl. in [place]? --    Constitutional: Alert and  oriented. Well appearing and in no distress. Musculoskeletal: Moderate, diffuse paronychia is noted around the nailbed of the right fifth digit. Neurologic:  Normal speech and language. No gross focal neurologic deficits are appreciated.  Skin:  Fluctuance and erythema is noted around the right fifth digit nail bed  Psychiatric: Mood and affect are normal. Speech and behavior are normal.   ____________________________________________  ED COURSE:  Pertinent labs & imaging results that were available during my care of the patient were reviewed by me and considered in my medical decision making (see chart for details). Patient will require incision and drainage of the paronychia. INCISION AND DRAINAGE Performed by: Lenise Arena E Consent: Verbal consent obtained. Risks and benefits: risks, benefits and alternatives were discussed Type: abscess  Body area: Right fifth digit, hand  Anesthesia: Digital block  Incision was made with a scalpel.  Local anesthetic: lidocaine 1 % with epinephrine  Anesthetic total: 3 ml  Complexity: complex Blunt dissection to break up loculations  Drainage: purulent  Drainage amount: Moderate   Packing material: None   Patient tolerance: Patient tolerated the procedure well with no immediate complications.   ____________________________________________    LABS (pertinent positives/negatives)  Labs Reviewed  GLUCOSE, CAPILLARY - Abnormal; Notable for the following:    Glucose-Capillary 428 (*)    All  other components within normal limits  COMPREHENSIVE METABOLIC PANEL - Abnormal; Notable for the following:    Glucose, Bld 426 (*)    Calcium 8.7 (*)    All other components within normal limits  CBC WITH DIFFERENTIAL/PLATELET   ____________________________________________  FINAL ASSESSMENT AND PLAN  Paronychia  Plan: Patient with labs as dictated above. Patient states she has not taken his metformin in several days and normally  this controls his blood sugars very well. I'll advise topical antibiotic ointment for his finger, I will refill his metformin prescription. He is stable for discharge.   Earleen Newport, MD   Note: This dictation was prepared with Dragon dictation. Any transcriptional errors that result from this process are unintentional   Earleen Newport, MD 09/14/15 [phone]

## 2015-09-14 NOTE — ED Notes (Signed)
Pt's CBG reported to MD Owens Shark

## 2015-09-14 NOTE — ED Notes (Signed)
Patient presents with infection of right pinky finger. States he had an ingrown nail he pulled out about two days ago. Today presents with pain and swelling around the nail on that finger.

## 2015-09-14 NOTE — Discharge Instructions (Signed)
Paronychia Paronychia is an infection of the skin that surrounds a nail. It usually affects the skin around a fingernail, but it may also occur near a toenail. It often causes pain and swelling around the nail. This condition may come on suddenly or develop over a longer period. In some cases, a collection of pus (abscess) can form near or under the nail. Usually, paronychia is not serious and it clears up with treatment. CAUSES This condition may be caused by bacteria or fungi. It is commonly caused by either Streptococcus or Staphylococcus bacteria. The bacteria or fungi often cause the infection by getting into the affected area through an opening in the skin, such as a cut or a hangnail. RISK FACTORS This condition is more likely to develop in:  People who get their hands wet often, such as those who work as Designer, industrial/product, bartenders, or nurses.  People who bite their fingernails or suck their thumbs.  People who trim their nails too short.  People who have hangnails or injured fingertips.  People who get manicures.  People who have diabetes. SYMPTOMS Symptoms of this condition include:  Redness and swelling of the skin near the nail.  Tenderness around the nail when you touch the area.  Pus-filled bumps under the cuticle. The cuticle is the skin at the base or sides of the nail.  Fluid or pus under the nail.  Throbbing pain in the area. DIAGNOSIS This condition is usually diagnosed with a physical exam. In some cases, a sample of pus may be taken from an abscess to be tested in a lab. This can help to determine what type of bacteria or fungi is causing the condition. TREATMENT Treatment for this condition depends on the cause and severity of the condition. If the condition is mild, it may clear up on its own in a few days. Your health care provider may recommend soaking the affected area in warm water a few times a day. When treatment is needed, the options may  include:  Antibiotic medicine, if the condition is caused by a bacterial infection.  Antifungal medicine, if the condition is caused by a fungal infection.  Incision and drainage, if an abscess is present. In this procedure, the health care provider will cut open the abscess so the pus can drain out. HOME CARE INSTRUCTIONS  Soak the affected area in warm water if directed to do so by your health care provider. You may be told to do this for 20 minutes, 2-3 times a day. Keep the area dry in between soakings.  Take medicines only as directed by your health care provider.  If you were prescribed an antibiotic medicine, finish all of it even if you start to feel better.  Keep the affected area clean.  Do not try to drain a fluid-filled bump yourself.  If you will be washing dishes or performing other tasks that require your hands to get wet, wear rubber gloves. You should also wear gloves if your hands might come in contact with irritating substances, such as cleaners or chemicals.  Follow your health care provider's instructions about:  Wound care.  Bandage (dressing) changes and removal. SEEK MEDICAL CARE IF:  Your symptoms get worse or do not improve with treatment.  You have a fever or chills.  You have redness spreading from the affected area.  You have continued or increased fluid, blood, or pus coming from the affected area.  Your finger or knuckle becomes swollen or is difficult to move.  This information is not intended to replace advice given to you by your health care provider. Make sure you discuss any questions you have with your health care provider.   Document Released: 10/08/2000 Document Revised: 08/29/2014 Document Reviewed: 03/22/2014 Elsevier Interactive Patient Education 2016 Elsevier Inc[employer]  Hyperglycemia High blood sugar (hyperglycemia) means that the level of sugar in your blood is higher than it should be. Signs of high blood sugar include:  Feeling  thirsty.  Frequent peeing (urinating).  Feeling tired or sleepy.  Dry mouth.  Vision changes.  Feeling weak.  Feeling hungry but losing weight.  Numbness and tingling in your hands or feet.  Headache. When you ignore these signs, your blood sugar may keep going up. These problems may get worse, and other problems may begin. HOME CARE  Check your blood sugars as told by your doctor. Write down the numbers with the date and time.  Take the right amount of insulin or diabetes pills at the right time. Write down the dose with date and time.  Refill your insulin or diabetes pills before running out.  Watch what you eat. Follow your meal plan.  Drink liquids without sugar, such as water. Check with your doctor if you have kidney or heart disease.  Follow your doctor's orders for exercise. Exercise at the same time of day.  Keep your doctor's appointments. GET HELP RIGHT AWAY IF:   You have trouble thinking or are confused.  You have fast breathing with fruity smelling breath.  You pass out (faint).  You have 2 to 3 days of high blood sugars and you do not know why.  You have chest pain.  You are feeling sick to your stomach (nauseous) or throwing up (vomiting).  You have sudden vision changes. MAKE SURE YOU:   Understand these instructions.  Will watch your condition.  Will get help right away if you are not doing well or get worse.   This information is not intended to replace advice given to you by your health care provider. Make sure you discuss any questions you have with your health care provider.   Document Released: 02/09/2009 Document Revised: 05/05/2014 Document Reviewed: 12/19/2014 Elsevier Interactive Patient Education [employer].

## 2015-11-07 ENCOUNTER — Emergency Department (HOSPITAL_COMMUNITY)
Admission: EM | Admit: 2015-11-07 | Discharge: 2015-11-07 | Disposition: A | Discharge status: Home / Self Care | Payer: BLUE CROSS/BLUE SHIELD | Attending: Emergency Medicine | Admitting: Emergency Medicine

## 2015-11-07 ENCOUNTER — Encounter (HOSPITAL_COMMUNITY): Payer: Self-pay | Admitting: Emergency Medicine

## 2015-11-07 DIAGNOSIS — Y929 Unspecified place or not applicable: Secondary | ICD-10-CM | POA: Insufficient documentation

## 2015-11-07 DIAGNOSIS — W57XXXA Bitten or stung by nonvenomous insect and other nonvenomous arthropods, initial encounter: Secondary | ICD-10-CM | POA: Insufficient documentation

## 2015-11-07 DIAGNOSIS — S30860A Insect bite (nonvenomous) of lower back and pelvis, initial encounter: Secondary | ICD-10-CM | POA: Diagnosis not present

## 2015-11-07 DIAGNOSIS — Y939 Activity, unspecified: Secondary | ICD-10-CM | POA: Diagnosis not present

## 2015-11-07 DIAGNOSIS — F17200 Nicotine dependence, unspecified, uncomplicated: Secondary | ICD-10-CM | POA: Diagnosis not present

## 2015-11-07 DIAGNOSIS — Y999 Unspecified external cause status: Secondary | ICD-10-CM | POA: Diagnosis not present

## 2015-11-07 DIAGNOSIS — E119 Type 2 diabetes mellitus without complications: Secondary | ICD-10-CM | POA: Diagnosis not present

## 2015-11-07 NOTE — ED Notes (Signed)
Patient able to ambulate independently  
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
## 2015-11-07 NOTE — ED Notes (Signed)
Pt. reports tick bite at back today , denies pain or discomfort , respirations unlabored /denies fever .

## 2015-11-07 NOTE — ED Provider Notes (Signed)
CSN: [id number]     Arrival date & time 11/07/15  2002 History  By signing my name below, I, Ephriam Jenkins, attest that this documentation has been prepared under the direction and in the presence of Shary Decamp PA-C.  Electronically Signed: Meriel Flavors, ED Scribe. 11/07/2015. 10:22 PM   Chief Complaint  Patient presents with  . Tick Removal    The history is provided by the patient. No language interpreter was used.   HPI Comments: Bryan Gray is a 35 y.o. male who presents to the Emergency Department requesting removal of a tic that was noticed noticed today. Pt's wife states he noticed the tic on his lower back after he got out of the shower this evening. [place]. Pt's wife attempted to remove the tic with no success. Pt states he is in no pain currently. Pt denies fever or joint pain. No flu- like symptoms.   Past Medical History  Diagnosis Date  . Diabetes mellitus without complication ([place])    History reviewed. No pertinent past surgical history. No family history on file. Social History  Substance Use Topics  . Smoking status: Current Every Day Smoker  . Smokeless tobacco: Never Used  . Alcohol Use: No    Review of Systems  Constitutional: Negative for fever.  Musculoskeletal: Negative for joint swelling and arthralgias.  Skin: Positive for wound (Tic bite to back).  All other systems reviewed and are negative.  Allergies  Review of patient's allergies indicates no known allergies.  Home Medications   Prior to Admission medications   Medication Sig Start Date End Date Taking? Authorizing Provider  metFORMIN (GLUCOPHAGE) 500 MG tablet Take 1 tablet (500 mg total) by mouth 2 (two) times daily with a meal. 09/14/15 09/13/16  Earleen Newport, MD  methocarbamol (ROBAXIN) 500 MG tablet Take 1 tablet (500 mg total) by mouth 2 (two) times daily as needed for muscle spasms. 05/09/15   Waynetta Pean, PA-C  mupirocin ointment (BACTROBAN) 2 %  Apply to affected area 3 times daily 09/14/15 09/13/16  Earleen Newport, MD  naproxen (NAPROSYN) 250 MG tablet Take 1 tablet (250 mg total) by mouth 2 (two) times daily with a meal. 05/09/15   Waynetta Pean, PA-C   BP 115/89 mmHg  Pulse 86  Temp(Src) 98.4 F (36.9 C) (Oral)  Resp 18  Ht 6' (1.829 m)  Wt 255 lb (115.667 kg)  BMI 34.58 kg/m2  SpO2 98%   Physical Exam  Constitutional: He is oriented to person, place, and time. He appears well-developed and well-nourished. No distress.  HENT:  Head: Normocephalic and atraumatic.  Eyes: EOM are normal.  Neck: Normal range of motion.  Cardiovascular: Normal rate and regular rhythm.   Pulmonary/Chest: Effort normal.  Abdominal: Soft.  Musculoskeletal: Normal range of motion.  Neurological: He is alert and oriented to person, place, and time.  Skin: Skin is warm and dry. He is not diaphoretic. No erythema.  Left lower back with tic head, non erythematous   Psychiatric: He has a normal mood and affect. His behavior is normal. Judgment and thought content normal.  Nursing note and vitals reviewed.  ED Course  Procedures  DIAGNOSTIC STUDIES: Oxygen Saturation is 98% on RA, normal by my interpretation.  COORDINATION OF CARE: 10:20 PM-Will order removal of tic. Discussed treatment plan with pt at bedside and pt agreed to plan.   Labs Review Labs Reviewed - No data to display  Imaging Review No  results found. I have personally reviewed and evaluated these images and lab results as part of my medical decision-making.   EKG Interpretation None      MDM  I have reviewed the relevant previous healthcare records. I obtained HPI from historian.  ED Course:  Assessment: Pt is a 35yM who presents with tick removal. Noticed today with possible exposure on Sunday. On exam, pt in NAD. Nontoxic/nonseptic appearing. VSS. Afebrile. No fever, malaise, no joint pain, no flu like illness. Tick removed without difficulty. Plan is to DC home  with follow up to PCP. Given symptoms to look for Lyme Disease. At time of discharge, Patient is in no acute distress. Vital Signs are stable. Patient is able to ambulate. Patient able to tolerate PO.    Disposition/Plan:  DC Home Additional Verbal discharge instructions given and discussed with patient.  Pt Instructed to f/u with PCP in the next week for evaluation and treatment of symptoms. Return precautions given Pt acknowledges and agrees with plan  Supervising Physician Leo Grosser, MD   Final diagnoses:  Tick bite with subsequent removal of tick      Shary Decamp, PA-C 11/07/15 2231  Leo Grosser, MD 11/08/15 [phone]

## 2016-02-07 ENCOUNTER — Emergency Department (HOSPITAL_COMMUNITY): Payer: BLUE CROSS/BLUE SHIELD

## 2016-02-07 ENCOUNTER — Encounter (HOSPITAL_COMMUNITY): Payer: Self-pay | Admitting: *Deleted

## 2016-02-07 ENCOUNTER — Emergency Department (HOSPITAL_COMMUNITY)
Admission: EM | Admit: 2016-02-07 | Discharge: 2016-02-07 | Disposition: A | Discharge status: Home / Self Care | Payer: BLUE CROSS/BLUE SHIELD | Attending: Emergency Medicine | Admitting: Emergency Medicine

## 2016-02-07 DIAGNOSIS — E119 Type 2 diabetes mellitus without complications: Secondary | ICD-10-CM | POA: Insufficient documentation

## 2016-02-07 DIAGNOSIS — S199XXA Unspecified injury of neck, initial encounter: Secondary | ICD-10-CM | POA: Diagnosis present

## 2016-02-07 DIAGNOSIS — Y92410 Unspecified street and highway as the place of occurrence of the external cause: Secondary | ICD-10-CM | POA: Diagnosis not present

## 2016-02-07 DIAGNOSIS — F17200 Nicotine dependence, unspecified, uncomplicated: Secondary | ICD-10-CM | POA: Insufficient documentation

## 2016-02-07 DIAGNOSIS — Z7984 Long term (current) use of oral hypoglycemic drugs: Secondary | ICD-10-CM | POA: Insufficient documentation

## 2016-02-07 DIAGNOSIS — Y939 Activity, unspecified: Secondary | ICD-10-CM | POA: Diagnosis not present

## 2016-02-07 DIAGNOSIS — M545 Low back pain: Secondary | ICD-10-CM | POA: Insufficient documentation

## 2016-02-07 DIAGNOSIS — Y999 Unspecified external cause status: Secondary | ICD-10-CM | POA: Diagnosis not present

## 2016-02-07 MED ORDER — METHOCARBAMOL 500 MG PO TABS: 500.0000 mg | ORAL_TABLET | Freq: Two times a day (BID) | ORAL | 0 refills | Status: DC

## 2016-02-07 MED ORDER — NAPROXEN 500 MG PO TABS: 500.0000 mg | ORAL_TABLET | Freq: Two times a day (BID) | ORAL | 0 refills | Status: DC
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
## 2016-02-07 NOTE — ED Notes (Signed)
Patient returned from xray.

## 2016-02-07 NOTE — ED Notes (Signed)
See np assessment
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
## 2016-09-11 ENCOUNTER — Emergency Department: Payer: Worker's Compensation

## 2016-09-11 ENCOUNTER — Encounter: Payer: Self-pay | Admitting: Emergency Medicine

## 2016-09-11 DIAGNOSIS — Z7984 Long term (current) use of oral hypoglycemic drugs: Secondary | ICD-10-CM | POA: Insufficient documentation

## 2016-09-11 DIAGNOSIS — Y929 Unspecified place or not applicable: Secondary | ICD-10-CM | POA: Diagnosis not present

## 2016-09-11 DIAGNOSIS — Y990 Civilian activity done for income or pay: Secondary | ICD-10-CM | POA: Diagnosis not present

## 2016-09-11 DIAGNOSIS — F17200 Nicotine dependence, unspecified, uncomplicated: Secondary | ICD-10-CM | POA: Diagnosis not present

## 2016-09-11 DIAGNOSIS — S60312A Abrasion of left thumb, initial encounter: Secondary | ICD-10-CM | POA: Diagnosis not present

## 2016-09-11 DIAGNOSIS — Y939 Activity, unspecified: Secondary | ICD-10-CM | POA: Insufficient documentation

## 2016-09-11 DIAGNOSIS — S6992XA Unspecified injury of left wrist, hand and finger(s), initial encounter: Secondary | ICD-10-CM | POA: Diagnosis present

## 2016-09-11 DIAGNOSIS — W231XXA Caught, crushed, jammed, or pinched between stationary objects, initial encounter: Secondary | ICD-10-CM | POA: Insufficient documentation

## 2016-09-11 DIAGNOSIS — E119 Type 2 diabetes mellitus without complications: Secondary | ICD-10-CM | POA: Insufficient documentation
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
## 2016-09-12 ENCOUNTER — Emergency Department
Admission: EM | Admit: 2016-09-12 | Discharge: 2016-09-12 | Disposition: A | Discharge status: Home / Self Care | Payer: Worker's Compensation | Attending: Emergency Medicine | Admitting: Emergency Medicine

## 2016-09-12 DIAGNOSIS — T148XXA Other injury of unspecified body region, initial encounter: Secondary | ICD-10-CM

## 2016-09-12 DIAGNOSIS — S6992XA Unspecified injury of left wrist, hand and finger(s), initial encounter: Secondary | ICD-10-CM

## 2016-09-12 MED ORDER — TRAMADOL HCL 50 MG PO TABS: 50.0000 mg | ORAL_TABLET | Freq: Four times a day (QID) | ORAL | 0 refills | Status: DC | PRN

## 2016-09-12 MED ORDER — OXYCODONE-ACETAMINOPHEN 5-325 MG PO TABS
1.0000 | ORAL_TABLET | Freq: Once | ORAL | Status: AC
  Administered 2016-09-12: 1 via ORAL
  Filled 2016-09-12: qty 1

## 2016-09-12 MED ORDER — BACITRACIN ZINC 500 UNIT/GM EX OINT
TOPICAL_OINTMENT | Freq: Two times a day (BID) | CUTANEOUS | Status: DC
  Administered 2016-09-12: 1 via TOPICAL
  Filled 2016-09-12: qty 0.9

## 2016-09-12 NOTE — Discharge Instructions (Signed)
Please follow up with orthopedic surgery. °

## 2016-09-12 NOTE — ED Provider Notes (Signed)
[facility] Provider Note   ____________________________________________   First MD Initiated Contact with Patient 09/12/16 [phone]     (approximate)  I have reviewed the triage vital signs and the nursing notes.   HISTORY  Chief Complaint Hand Injury    HPI [facility] is a 36 y.o. 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 He reports he has thumb with a hammer. He states that his pain is a 7 out of 10 in intensity currently. He did not ice his thumb or take any medicine that he is able to move it slightly but it is limited due to pain. The patient states that he broke his thumb in high school and has a residual deformity from that injury. The patient came straight from work to have it evaluated. Nothing makes it better and moving it makes it worse. The patient denies any dizziness, lightheadedness, chest pain, shortness of breath, nausea vomiting.   Past Medical History:  Diagnosis Date  . Diabetes mellitus without complication ([place])     There are no active problems to display for this patient.   History reviewed. No pertinent surgical history.  Prior to Admission medications   Medication Sig Start Date End Date Taking? Authorizing Provider  metFORMIN (GLUCOPHAGE) 500 MG tablet Take 1 tablet (500 mg total) by mouth 2 (two) times daily with a meal. 09/14/15 09/13/16  Earleen Newport, MD  methocarbamol (ROBAXIN) 500 MG tablet Take 1 tablet (500 mg total) by mouth 2 (two) times daily. 02/07/16   Etta Quill, NP  mupirocin ointment Drue Stager) 2 % Apply to affected area 3 times daily 09/14/15 09/13/16  Earleen Newport, MD  naproxen (NAPROSYN) 500 MG tablet Take 1 tablet (500 mg total) by mouth 2 (two) times daily. 02/07/16   Etta Quill, NP  traMADol (ULTRAM) 50 MG tablet Take 1 tablet (50 mg total) by mouth every 6 (six) hours as needed. 09/12/16   Loney Hering, MD     Allergies Patient has no known allergies.  History reviewed. No pertinent family history.  Social History Social History  Substance Use Topics  . Smoking status: Current Every Day Smoker  . Smokeless tobacco: Never Used  . Alcohol use No    Review of Systems  Constitutional: No fever/chills Eyes: No visual changes. ENT: No sore throat. Cardiovascular: Denies chest pain. Respiratory: Denies shortness of breath. Gastrointestinal: No abdominal pain.  No nausea, no vomiting.  No diarrhea.  No constipation. Genitourinary: Negative for dysuria. Musculoskeletal: Left thumb pain Skin: Laceration to left thumb Neurological: Negative for headaches, focal weakness or numbness.   ____________________________________________   PHYSICAL EXAM:  VITAL SIGNS: ED Triage Vitals  Enc Vitals Group     BP 09/11/16 2116 128/65     Pulse Rate 09/11/16 2116 84     Resp 09/11/16 2116 18     Temp 09/11/16 2116 98.3 F (36.8 C)     Temp Source 09/11/16 2116 Oral     SpO2 09/11/16 2116 98 %     Weight 09/11/16 2117 250 lb (113.4 kg)     Height 09/11/16 2117 6' (1.829 m)     Head Circumference --      Peak Flow --      Pain Score 09/11/16 2116 9     Pain Loc --      Pain Edu? --      Excl. in [place]? --  Constitutional: Alert and oriented. Well appearing and in Moderate distress. Eyes: Conjunctivae are normal. PERRL. EOMI. Head: Atraumatic. Nose: No congestion/rhinnorhea. Mouth/Throat: Mucous membranes are moist.  Oropharynx non-erythematous. Cardiovascular: Normal rate, regular rhythm. Grossly normal heart sounds.  Good peripheral circulation. Respiratory: Normal respiratory effort.  No retractions. Lungs CTAB. Gastrointestinal: Soft and nontender. No distention. Positive bowel sounds Musculoskeletal: Tenderness to palpation of the left thumb MCP with some movement in the patient's DIP  Neurologic:  Normal speech and language. No gross focal neurologic deficits are appreciated.  No gait instability. Skin:  Skin is warm, dry abrasion noted to right thumb and deep abrasion noted to lateral left thumb. Psychiatric: Mood and affect are normal.   ____________________________________________   LABS (all labs ordered are listed, but only abnormal results are displayed)  Labs Reviewed - No data to display ____________________________________________  EKG  none ____________________________________________  RADIOLOGY  Left hand xray ____________________________________________   PROCEDURES  Procedure(s) performed: please, see procedure note(s).  .Splint Application Date/Time: 9/56/2130 1:15 AM Performed by: Loney Hering Authorized by: Loney Hering   Consent:    Consent obtained:  Verbal   Consent given by:  Patient Pre-procedure details:    Sensation:  Normal Procedure details:    Laterality:  Left   Location:  Wrist   Cast type:  Short arm   Splint type:  Thumb spica   Supplies:  Ortho-Glass Post-procedure details:    Pain:  Unchanged   Sensation:  Normal   Patient tolerance of procedure:  Tolerated well, no immediate complications    Critical Care performed: No  ____________________________________________   INITIAL IMPRESSION / ASSESSMENT AND PLAN / ED COURSE  Pertinent labs & imaging results that were available during my care of the patient were reviewed by me and considered in my medical decision making (see chart for details).  This is a 36 year old who comes into the hospital today with a thumb injury from work. The patient hit his thumb with a hammer. It is not broken on x-ray. The patient appears to have some subluxation but he reports that he's injured in the past and has been like that. I will place the patient in a thumb spica splint. His laceration does not need repair as it is a deep abrasion and not amenable to repair. Once he has had some bacitracin and gauze placed on his thumb and a thumb spica he will be  discharged to follow-up with orthopedic surgery.  Clinical Course as of Sep 12 136  Fri Sep 12, 2016  8657 No acute osseous abnormality about the left hand. DG Hand Complete Left [AW]    Clinical Course User Index [AW] Loney Hering, MD   The patient had his splint placed. He'll be discharged to home.  ____________________________________________   FINAL CLINICAL IMPRESSION(S) / ED DIAGNOSES  Final diagnoses:  Injury of left thumb, initial encounter  Abrasion      NEW MEDICATIONS STARTED DURING THIS VISIT:  New Prescriptions   TRAMADOL (ULTRAM) 50 MG TABLET    Take 1 tablet (50 mg total) by mouth every 6 (six) hours as needed.     Note:  This document was prepared using Dragon voice recognition software and may include unintentional dictation errors.    Loney Hering, MD 09/12/16 [phone]

## 2016-09-12 NOTE — ED Notes (Signed)

## 2017-07-17 IMAGING — CR DG HAND COMPLETE 3+V*L*
3 series · 3 of 3 positions shown · non-contrast
Comparison: None.

CLINICAL DATA: Initial evaluation for acute trauma, struck from
with hammer.

EXAM:
LEFT HAND - COMPLETE 3+ VIEW

[hand ap]
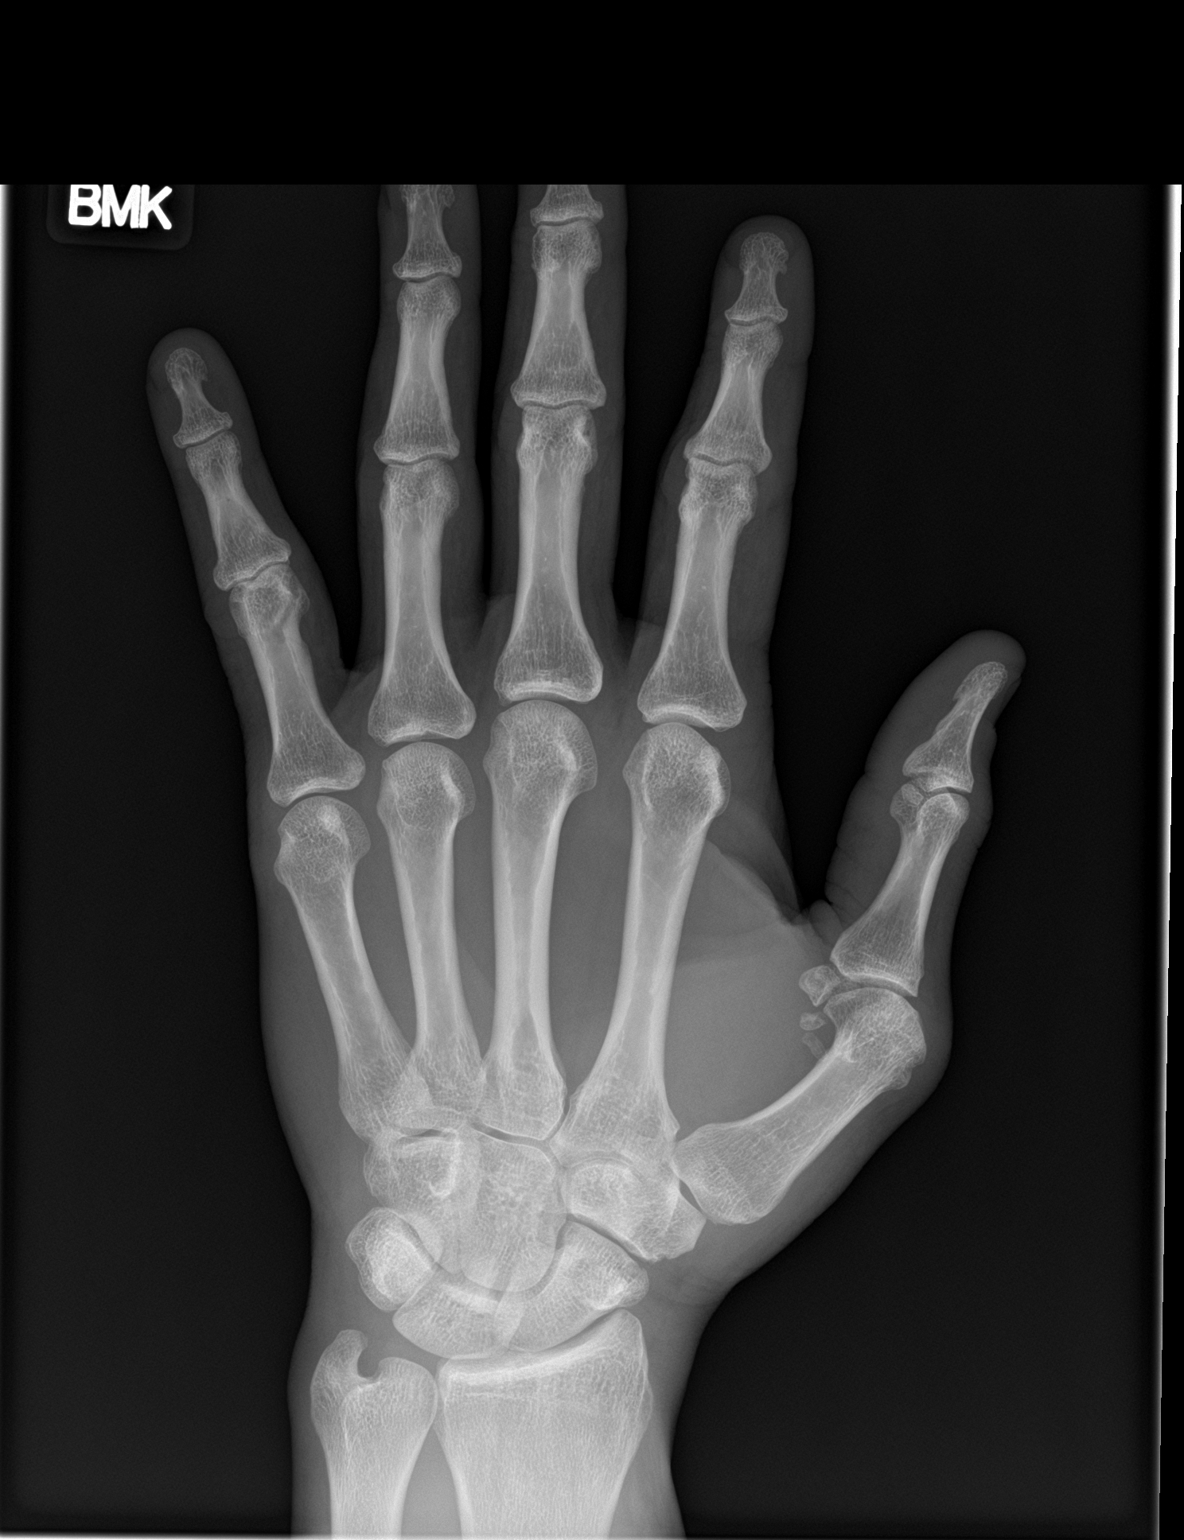

[hand obl]
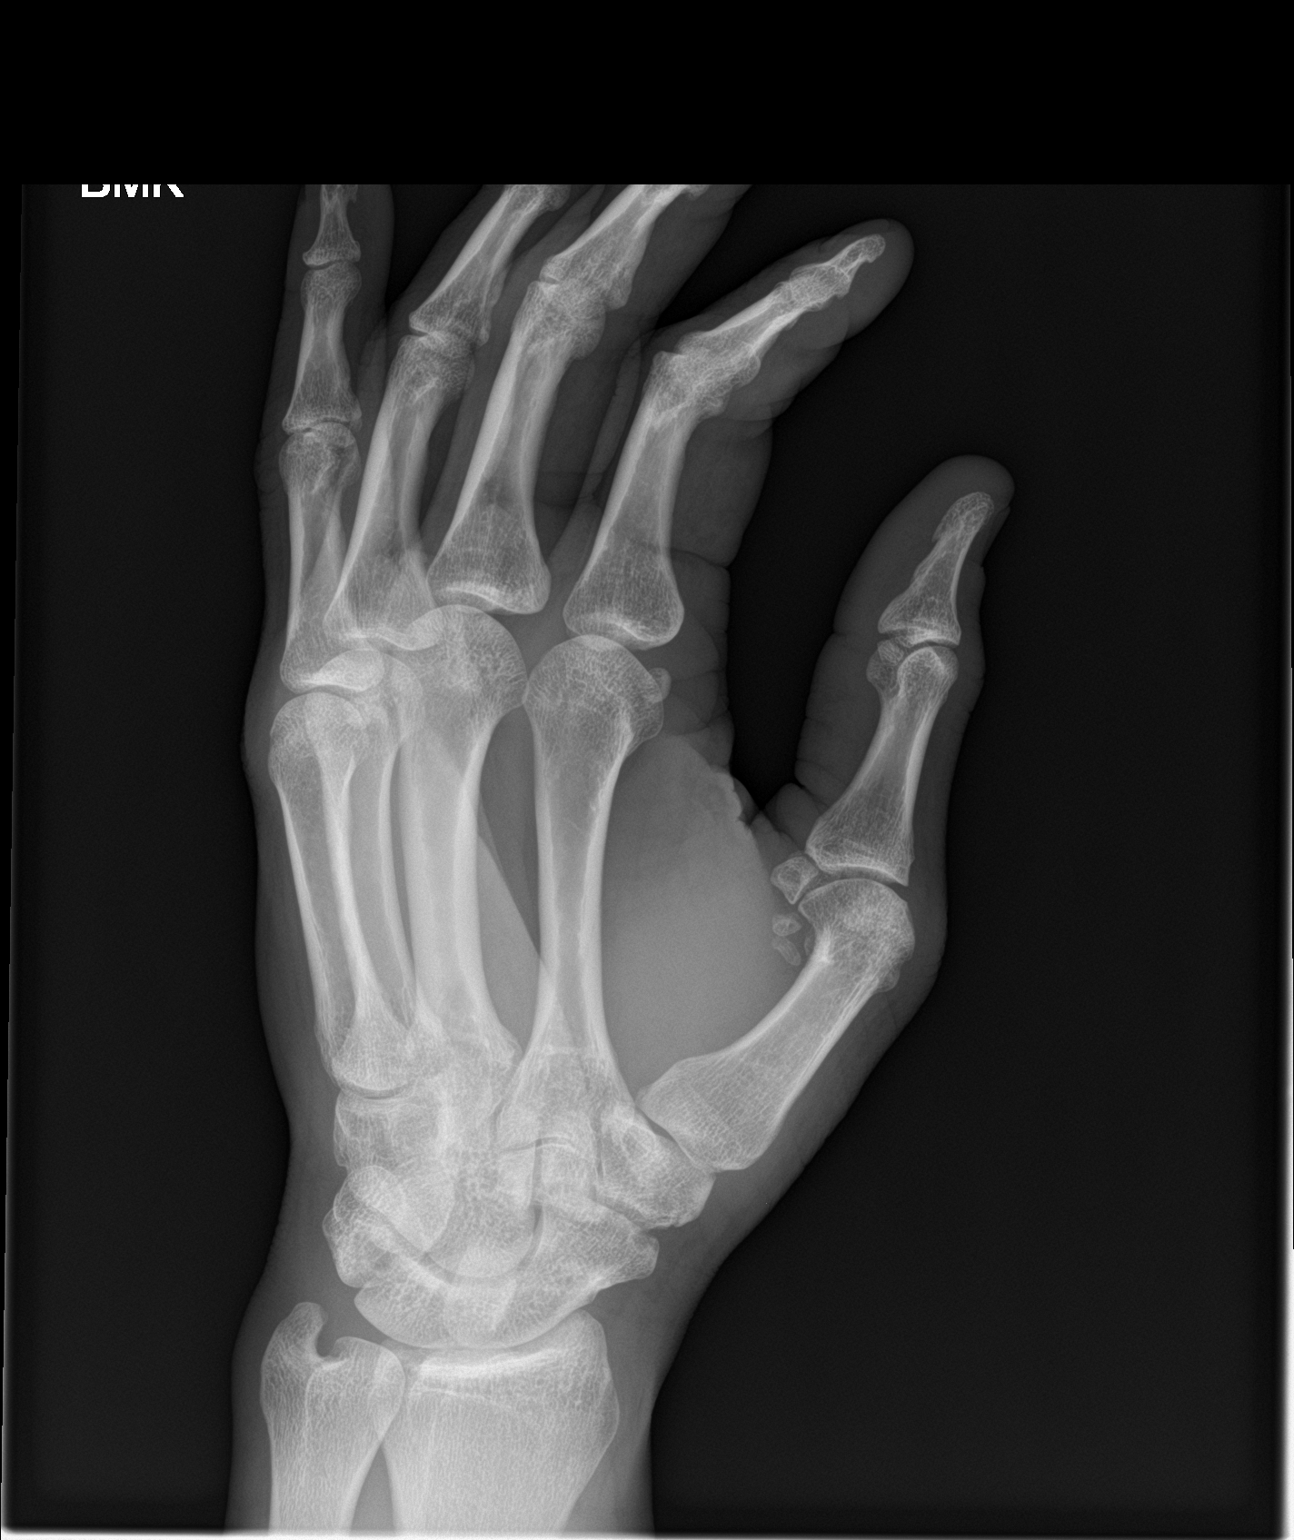

[hand lat]
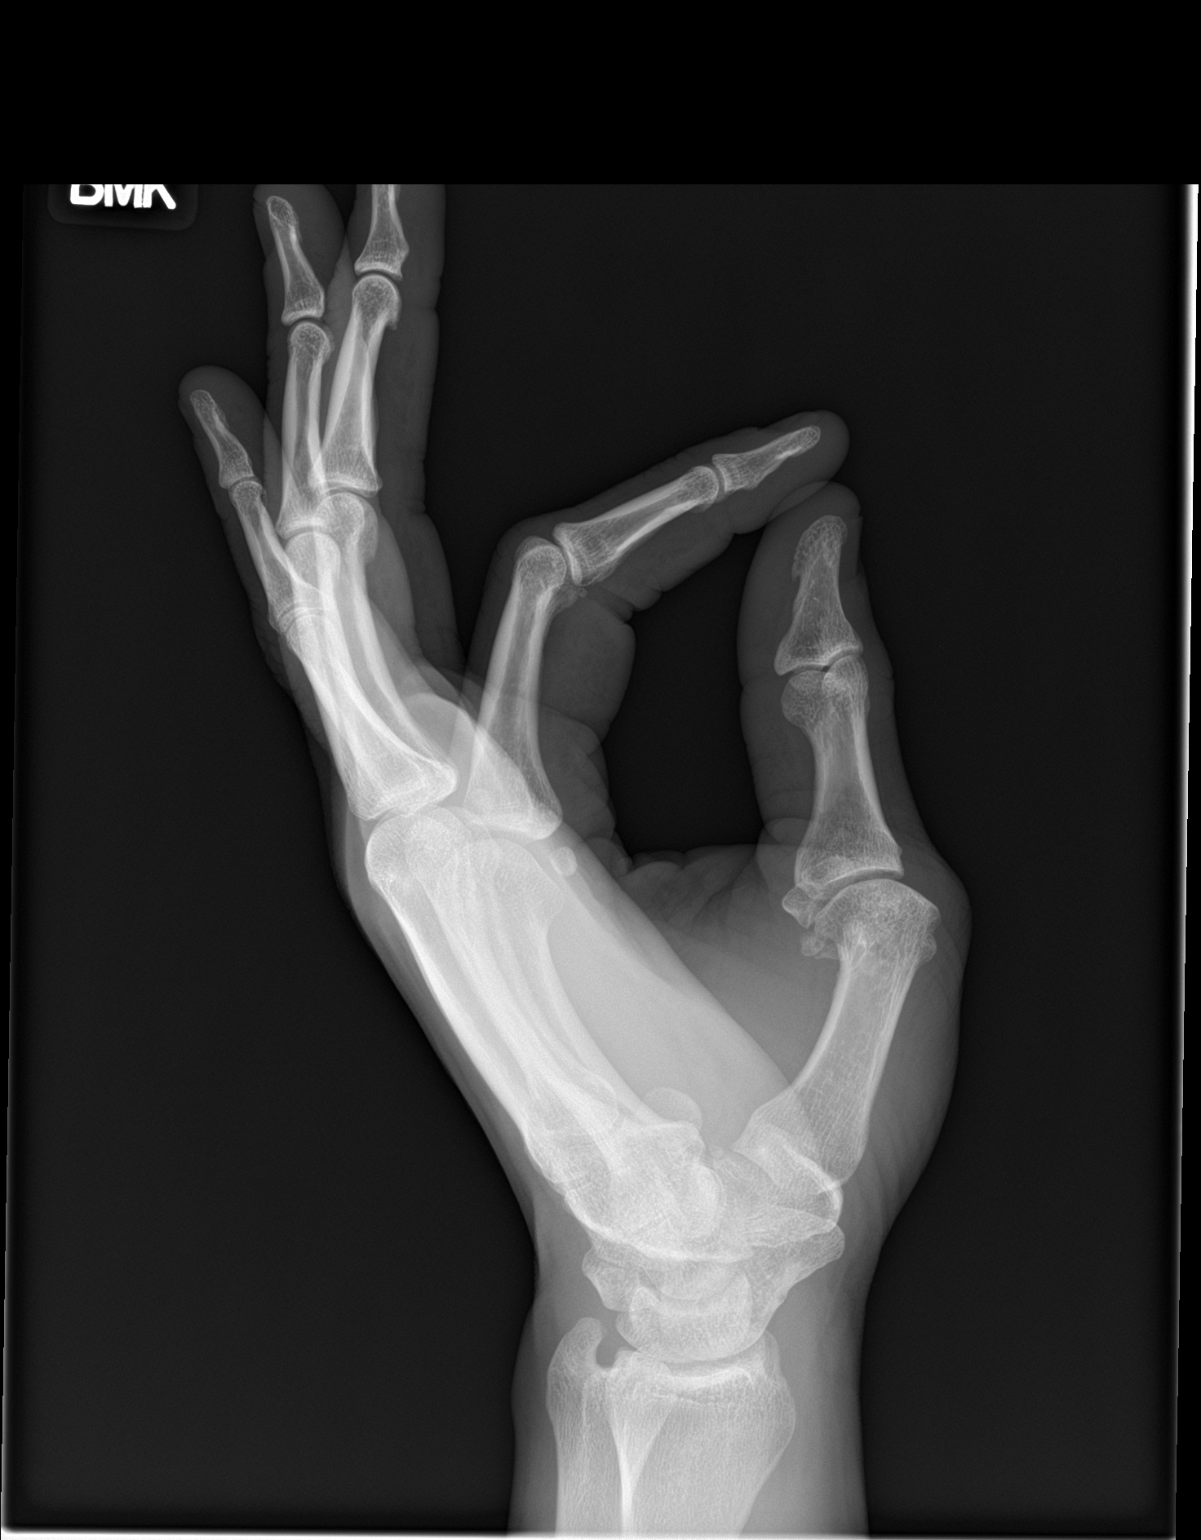

[3 of 3 positions shown; findings below may reference images not displayed]

FINDINGS: No acute fracture dislocation. Scattered degenerative osteoarthritic
changes present throughout the hand. Osseous mineralization normal.
No soft tissue abnormality.
IMPRESSION: No acute osseous abnormality about the left hand.

## 2018-01-11 ENCOUNTER — Emergency Department: Payer: BLUE CROSS/BLUE SHIELD

## 2018-01-11 ENCOUNTER — Encounter: Payer: Self-pay | Admitting: Emergency Medicine

## 2018-01-11 ENCOUNTER — Other Ambulatory Visit: Payer: Self-pay

## 2018-01-11 ENCOUNTER — Emergency Department
Admission: EM | Admit: 2018-01-11 | Discharge: 2018-01-11 | Disposition: A | Discharge status: Home / Self Care | Payer: BLUE CROSS/BLUE SHIELD | Attending: Emergency Medicine | Admitting: Emergency Medicine

## 2018-01-11 DIAGNOSIS — I82431 Acute embolism and thrombosis of right popliteal vein: Secondary | ICD-10-CM | POA: Insufficient documentation

## 2018-01-11 DIAGNOSIS — Z79899 Other long term (current) drug therapy: Secondary | ICD-10-CM | POA: Diagnosis not present

## 2018-01-11 DIAGNOSIS — E119 Type 2 diabetes mellitus without complications: Secondary | ICD-10-CM | POA: Insufficient documentation

## 2018-01-11 DIAGNOSIS — F17210 Nicotine dependence, cigarettes, uncomplicated: Secondary | ICD-10-CM | POA: Insufficient documentation

## 2018-01-11 DIAGNOSIS — R2241 Localized swelling, mass and lump, right lower limb: Secondary | ICD-10-CM | POA: Diagnosis present

## 2018-01-11 DIAGNOSIS — R600 Localized edema: Secondary | ICD-10-CM

## 2018-01-11 MED ORDER — ELIQUIS 5 MG VTE STARTER PACK: ORAL_TABLET | ORAL | 0 refills | Status: DC

## 2018-01-11 NOTE — ED Provider Notes (Signed)
[facility] Emergency Department Provider Note   ____________________________________________   I have reviewed the triage vital signs and the nursing notes.   HISTORY  Chief Complaint Leg Swelling   History limited by: Not Limited   HPI Bryan Gray is a 37 y.o. male who presents to the emergency department today because of concerns for right lower extremity swelling and discomfort.  Patient states his symptoms started 4 days.  The pain is located in the lower leg.  He states that the pain and swelling reminds morning he had a deep vein thrombosis in the past.  It has been a number of years since his last blood clot.  He is not currently on any anticoagulant.  He states he has not seen a hematologist nor has he had a hypercoagulability work-up.  Patient denies any shortness of breath or chest pain.    Per medical record review patient has a history of DVT  Past Medical History:  Diagnosis Date  . Diabetes mellitus without complication ([place])     There are no active problems to display for this patient.   History reviewed. No pertinent surgical history.  Prior to Admission medications   Medication Sig Start Date End Date Taking? Authorizing Provider  metFORMIN (GLUCOPHAGE) 500 MG tablet Take 1 tablet (500 mg total) by mouth 2 (two) times daily with a meal. 09/14/15 09/13/16  Earleen Newport, MD  methocarbamol (ROBAXIN) 500 MG tablet Take 1 tablet (500 mg total) by mouth 2 (two) times daily. 02/07/16   Etta Quill, NP  naproxen (NAPROSYN) 500 MG tablet Take 1 tablet (500 mg total) by mouth 2 (two) times daily. 02/07/16   Etta Quill, NP  traMADol (ULTRAM) 50 MG tablet Take 1 tablet (50 mg total) by mouth every 6 (six) hours as needed. 09/12/16   Loney Hering, MD    Allergies Patient has no known allergies.  No family history on file.  Social History Social History   Tobacco Use  . Smoking status: Current Every Day Smoker   Packs/day: 1.00    Types: Cigarettes  . Smokeless tobacco: Never Used  Substance Use Topics  . Alcohol use: No  . Drug use: No    Review of Systems Constitutional: No fever/chills Eyes: No visual changes. ENT: No sore throat. Cardiovascular: Denies chest pain. Respiratory: Denies shortness of breath. Gastrointestinal: No abdominal pain.  No nausea, no vomiting.  No diarrhea.   Genitourinary: Negative for dysuria. Musculoskeletal: Positive for right leg swelling and pain Skin: Negative for rash. Neurological: Negative for headaches, focal weakness or numbness.  ____________________________________________   PHYSICAL EXAM:  VITAL SIGNS: ED Triage Vitals  Enc Vitals Group     BP 01/11/18 1222 138/76     Pulse Rate 01/11/18 1222 96     Resp 01/11/18 1222 20     Temp 01/11/18 1222 98.5 F (36.9 C)     Temp Source 01/11/18 1222 Oral     SpO2 01/11/18 1222 97 %     Weight 01/11/18 1224 245 lb (111.1 kg)     Height 01/11/18 1224 6' (1.829 m)     Head Circumference --      Peak Flow --      Pain Score 01/11/18 1223 8   Constitutional: Alert and oriented.  Eyes: Conjunctivae are normal.  ENT      Head: Normocephalic and atraumatic.      Nose: No congestion/rhinnorhea.      Mouth/Throat: Mucous membranes are moist.  Neck: No stridor. Hematological/Lymphatic/Immunilogical: No cervical lymphadenopathy. Cardiovascular: Normal rate, regular rhythm.  No murmurs, rubs, or gallops.  Respiratory: Normal respiratory effort without tachypnea nor retractions. Breath sounds are clear and equal bilaterally. No wheezes/rales/rhonchi. Gastrointestinal: Soft and non tender. No rebound. No guarding.  Genitourinary: Deferred Musculoskeletal: Normal range of motion in all extremities.  Right leg in particular calf with appreciable swelling.  No skin discoloration. Neurologic:  Normal speech and language. No gross focal neurologic deficits are appreciated.  Skin:  Skin is warm, dry and  intact. No rash noted. Psychiatric: Mood and affect are normal. Speech and behavior are normal. Patient exhibits appropriate insight and judgment.  ____________________________________________    LABS (pertinent positives/negatives)  None  ____________________________________________   EKG  None  ____________________________________________    RADIOLOGY  [place] RLE Positive for thrombus in popliteal vein. Appears subacute/chronic  ____________________________________________   PROCEDURES  Procedures  ____________________________________________   INITIAL IMPRESSION / ASSESSMENT AND PLAN / ED COURSE  Pertinent labs & imaging results that were available during my care of the patient were reviewed by me and considered in my medical decision making (see chart for details).   Patient presented to the emergency department today because of concerns for right leg swelling and pain.  Patient does have a history of DVT.  On exam his right lower extremity was swollen.  Ultrasound does show a DVT in the popliteal vein.  The patient will be started on anitcoagulation.  Discussed with the patient importance of hematology follow-up. _______________________________________   FINAL CLINICAL IMPRESSION(S) / ED DIAGNOSES  Final diagnoses:  Leg edema  Deep vein thrombosis (DVT) of popliteal vein of right lower extremity, unspecified chronicity ([place])     Note: This dictation was prepared with Dragon dictation. Any transcriptional errors that result from this process are unintentional     Nance Pear, MD 01/11/18 1556

## 2018-01-11 NOTE — ED Triage Notes (Signed)
R leg edema x 4 days, no fall or injury.

## 2018-01-11 NOTE — Discharge Instructions (Addendum)
Please seek medical attention for any high fevers, chest pain, shortness of breath, change in behavior, persistent vomiting, bloody stool or any other new or concerning symptoms.  

## 2019-07-05 ENCOUNTER — Other Ambulatory Visit: Payer: Self-pay

## 2019-07-05 ENCOUNTER — Encounter: Payer: Self-pay | Admitting: Emergency Medicine

## 2019-07-05 ENCOUNTER — Emergency Department
Admission: EM | Admit: 2019-07-05 | Discharge: 2019-07-05 | Disposition: A | Discharge status: Home / Self Care | Payer: HRSA Program | Attending: Emergency Medicine | Admitting: Emergency Medicine

## 2019-07-05 DIAGNOSIS — J069 Acute upper respiratory infection, unspecified: Secondary | ICD-10-CM | POA: Insufficient documentation

## 2019-07-05 DIAGNOSIS — E119 Type 2 diabetes mellitus without complications: Secondary | ICD-10-CM | POA: Insufficient documentation

## 2019-07-05 DIAGNOSIS — R05 Cough: Secondary | ICD-10-CM | POA: Diagnosis present

## 2019-07-05 DIAGNOSIS — F17210 Nicotine dependence, cigarettes, uncomplicated: Secondary | ICD-10-CM | POA: Diagnosis not present

## 2019-07-05 DIAGNOSIS — Z20822 Contact with and (suspected) exposure to covid-19: Secondary | ICD-10-CM | POA: Diagnosis not present

## 2019-07-05 MED ORDER — PSEUDOEPH-BROMPHEN-DM 30-2-10 MG/5ML PO SYRP: 5.0000 mL | ORAL_SOLUTION | Freq: Four times a day (QID) | ORAL | 0 refills | Status: DC | PRN

## 2019-07-05 NOTE — ED Provider Notes (Signed)
[facility] Emergency Department Provider Note   ____________________________________________   First MD Initiated Contact with Patient 07/05/19 1214     (approximate)  I have reviewed the triage vital signs and the nursing notes.   HISTORY  Chief Complaint Facial Pain   HPI Bryan Gray is a 39 y.o. male presents to the ED with complaint of sinus pressure and drainage for 2 days.  Patient states he has had an occasional cough and coughed at work which made him very nervous that he may have Covid.  He states that work told him he would need a Covid test before coming back to work.  Patient denies any fever, chills, nausea or vomiting.  He has had no diarrhea.  He is unaware of any known Covid exposure.  He denies any change in taste or smell.  He has taken a "sinus medicine" yesterday.  He states he took 4 pills yesterday but is unaware of the name of the medication.  Patient does continue to smoke daily.  He denies any pain at this time.         Past Medical History:  Diagnosis Date  . Diabetes mellitus without complication ([place])     There are no problems to display for this patient.   History reviewed. No pertinent surgical history.  Prior to Admission medications   Medication Sig Start Date End Date Taking? Authorizing Provider  brompheniramine-pseudoephedrine-DM 30-2-10 MG/5ML syrup Take 5 mLs by mouth 4 (four) times daily as needed. 07/05/19   Johnn Hai, PA-C    Allergies Patient has no known allergies.  No family history on file.  Social History Social History   Tobacco Use  . Smoking status: Current Every Day Smoker    Packs/day: 1.00    Types: Cigarettes  . Smokeless tobacco: Never Used  Substance Use Topics  . Alcohol use: No  . Drug use: No    Review of Systems Constitutional: No fever/chills Eyes: No visual changes. ENT: No sore throat.  Negative for ear pain. Cardiovascular: Denies chest pain. Respiratory:  Denies shortness of breath.  Positive for cough. Gastrointestinal: No abdominal pain.  No nausea, no vomiting.  No diarrhea.   Musculoskeletal: Negative for muscle aches. Skin: Negative for rash. Neurological: Negative for headaches, focal weakness or numbness. ____________________________________________   PHYSICAL EXAM:  VITAL SIGNS: ED Triage Vitals [07/05/19 1206]  Enc Vitals Group     BP      Pulse      Resp      Temp      Temp src      SpO2      Weight 245 lb (111.1 kg)     Height 6' (1.829 m)     Head Circumference      Peak Flow      Pain Score 0     Pain Loc      Pain Edu?      Excl. in [place]?    Constitutional: Alert and oriented. Well appearing and in no acute distress. Eyes: Conjunctivae are normal.  Head: Atraumatic. Nose: Minimal congestion/rhinnorhea.  EACs and TMs are clear bilaterally. Mouth/Throat: Mucous membranes are moist.  Oropharynx non-erythematous. Neck: No stridor.   Hematological/Lymphatic/Immunilogical: No cervical lymphadenopathy. Cardiovascular: Normal rate, regular rhythm. Grossly normal heart sounds.  Good peripheral circulation. Respiratory: Normal respiratory effort.  No retractions. Lungs CTAB. Gastrointestinal: Soft and nontender. No distention.  Musculoskeletal: Moves upper and lower extremities with any difficulty normal gait was noted. Neurologic:  Normal speech and language. No gross focal neurologic deficits are appreciated. No gait instability. Skin:  Skin is warm, dry and intact. No rash noted. Psychiatric: Mood and affect are normal. Speech and behavior are normal.  ____________________________________________   LABS (all labs ordered are listed, but only abnormal results are displayed)  Labs Reviewed  SARS CORONAVIRUS 2 (TAT 6-24 HRS)     PROCEDURES  Procedure(s) performed (including Critical Care):  Procedures   ____________________________________________   INITIAL IMPRESSION / ASSESSMENT AND PLAN / ED  COURSE  As part of my medical decision making, I reviewed the following data within the electronic MEDICAL RECORD NUMBER Notes from prior ED visits and [place] Controlled Substance Database  [facility] Gray was evaluated in Emergency Department on 07/05/2019 for the symptoms described in the history of present illness. He was evaluated in the context of the global COVID-19 pandemic, which necessitated consideration that the patient might be at risk for infection with the SARS-CoV-2 virus that causes COVID-19. Institutional protocols and algorithms that pertain to the evaluation of patients at risk for COVID-19 are in a state of rapid change based on information released by regulatory bodies including the CDC and federal and state organizations. These policies and algorithms were followed during the patient's care in the ED.  39 year old male presents to the ED with complaint of 2 days URI symptoms with a cough.  Patient states that he coughed while at work today and was told not to come back until he has had a Covid test.  Patient denies any fever or chills.  He denies any known Covid exposure.  Patient was given a prescription for Bromfed-DM as needed for cough and congestion.  He is aware that he can get the results of his Covid test on my chart and until he sees a negative test he is to stay quarantined.  ____________________________________________   FINAL CLINICAL IMPRESSION(S) / ED DIAGNOSES  Final diagnoses:  Acute upper respiratory infection     ED Discharge Orders         Ordered    brompheniramine-pseudoephedrine-DM 30-2-10 MG/5ML syrup  4 times daily PRN     07/05/19 1222           Note:  This document was prepared using Dragon voice recognition software and may include unintentional dictation errors.    Johnn Hai, PA-C 07/05/19 1357    Earleen Newport, MD 07/05/19 [phone]

## 2019-07-05 NOTE — ED Triage Notes (Signed)
Presents with some sinus pressure and drainage   States sx's started couple of days ago   Has had occasional cough  No fever

## 2019-07-05 NOTE — Discharge Instructions (Signed)
Follow-up with your primary care provider if any continued problems or concerns.  You may also follow-up with [facility] acute care if you do not have a primary care provider.  Increase fluids.  Take Tylenol or ibuprofen if needed for headache, muscle aches or throat pain.  Begin taking Bromfed-DM as needed for cough and congestion.  You'll need to quarantine until you receive the results of your Covid test.  You may see this results on your "my chart".

## 2019-07-06 LAB — SARS CORONAVIRUS 2 (TAT 6-24 HRS): SARS Coronavirus 2: NEGATIVE

## 2020-09-17 ENCOUNTER — Other Ambulatory Visit: Payer: Self-pay

## 2020-09-17 ENCOUNTER — Encounter (HOSPITAL_COMMUNITY): Payer: Self-pay | Admitting: Emergency Medicine

## 2020-09-17 ENCOUNTER — Emergency Department (HOSPITAL_COMMUNITY)
Admission: EM | Admit: 2020-09-17 | Discharge: 2020-09-17 | Disposition: A | Discharge status: Home / Self Care | Payer: Self-pay | Attending: Emergency Medicine | Admitting: Emergency Medicine

## 2020-09-17 ENCOUNTER — Emergency Department (HOSPITAL_COMMUNITY): Payer: Self-pay

## 2020-09-17 DIAGNOSIS — Z28310 Unvaccinated for covid-19: Secondary | ICD-10-CM | POA: Insufficient documentation

## 2020-09-17 DIAGNOSIS — Z7984 Long term (current) use of oral hypoglycemic drugs: Secondary | ICD-10-CM | POA: Insufficient documentation

## 2020-09-17 DIAGNOSIS — U071 COVID-19: Secondary | ICD-10-CM | POA: Insufficient documentation

## 2020-09-17 DIAGNOSIS — F17210 Nicotine dependence, cigarettes, uncomplicated: Secondary | ICD-10-CM | POA: Insufficient documentation

## 2020-09-17 DIAGNOSIS — R000 Tachycardia, unspecified: Secondary | ICD-10-CM | POA: Insufficient documentation

## 2020-09-17 DIAGNOSIS — E119 Type 2 diabetes mellitus without complications: Secondary | ICD-10-CM | POA: Insufficient documentation

## 2020-09-17 DIAGNOSIS — R1013 Epigastric pain: Secondary | ICD-10-CM | POA: Insufficient documentation

## 2020-09-17 LAB — COMPREHENSIVE METABOLIC PANEL
ALT: 17 U/L (ref 0–44)
AST: 17 U/L (ref 15–41)
Albumin: 3.5 g/dL (ref 3.5–5.0)
Alkaline Phosphatase: 70 U/L (ref 38–126)
Anion gap: 7 (ref 5–15)
BUN: 7 mg/dL (ref 6–20)
CO2: 21 mmol/L — ABNORMAL LOW (ref 22–32)
Calcium: 8 mg/dL — ABNORMAL LOW (ref 8.9–10.3)
Chloride: 108 mmol/L (ref 98–111)
Creatinine, Ser: 1 mg/dL (ref 0.61–1.24)
GFR, Estimated: >60 mL/min (ref >60)
Glucose, Bld: 365 mg/dL — ABNORMAL HIGH (ref 70–99)
Potassium: 3.4 mmol/L — ABNORMAL LOW (ref 3.5–5.1)
Sodium: 136 mmol/L (ref 135–145)
Total Bilirubin: 0.4 mg/dL (ref 0.3–1.2)
Total Protein: 6.8 g/dL (ref 6.5–8.1)

## 2020-09-17 LAB — RESP PANEL BY RT-PCR (FLU A&B, COVID) ARPGX2
Influenza A by PCR: NEGATIVE
Influenza B by PCR: NEGATIVE
SARS Coronavirus 2 by RT PCR: POSITIVE — AB

## 2020-09-17 LAB — CBC WITH DIFFERENTIAL/PLATELET
Abs Immature Granulocytes: 0.02 10*3/uL (ref 0.00–0.07)
Basophils Absolute: 0 10*3/uL (ref 0.0–0.1)
Basophils Relative: 0 %
Eosinophils Absolute: 0.1 10*3/uL (ref 0.0–0.5)
Eosinophils Relative: 1 %
HCT: 38.5 % — ABNORMAL LOW (ref 39.0–52.0)
Hemoglobin: 12.9 g/dL — ABNORMAL LOW (ref 13.0–17.0)
Immature Granulocytes: 0 %
Lymphocytes Relative: 5 %
Lymphs Abs: 0.4 10*3/uL — ABNORMAL LOW (ref 0.7–4.0)
MCH: 32.9 pg (ref 26.0–34.0)
MCHC: 33.5 g/dL (ref 30.0–36.0)
MCV: 98.2 fL (ref 80.0–100.0)
Monocytes Absolute: 0.6 10*3/uL (ref 0.1–1.0)
Monocytes Relative: 8 %
Neutro Abs: 5.9 10*3/uL (ref 1.7–7.7)
Neutrophils Relative %: 86 %
Platelets: 198 10*3/uL (ref 150–400)
RBC: 3.92 MIL/uL — ABNORMAL LOW (ref 4.22–5.81)
RDW: 11.8 % (ref 11.5–15.5)
WBC: 6.9 10*3/uL (ref 4.0–10.5)
nRBC: 0 % (ref 0.0–0.2)

## 2020-09-17 LAB — PROTIME-INR
INR: 1.1 (ref 0.8–1.2)
Prothrombin Time: 13.7 seconds (ref 11.4–15.2)

## 2020-09-17 LAB — APTT: aPTT: 24 seconds (ref 24–36)

## 2020-09-17 LAB — URINALYSIS, ROUTINE W REFLEX MICROSCOPIC
Bacteria, UA: NONE SEEN
Bilirubin Urine: NEGATIVE
Glucose, UA: >=500 mg/dL — AB
Hgb urine dipstick: NEGATIVE
Ketones, ur: NEGATIVE mg/dL
Leukocytes,Ua: NEGATIVE
Nitrite: NEGATIVE
Protein, ur: NEGATIVE mg/dL
Specific Gravity, Urine: 1.03 (ref 1.005–1.030)
pH: 5 (ref 5.0–8.0)

## 2020-09-17 LAB — LACTIC ACID, PLASMA
Lactic Acid, Venous: 2.9 mmol/L (ref 0.5–1.9)
Lactic Acid, Venous: 1.3 mmol/L (ref 0.5–1.9)

## 2020-09-17 LAB — LIPASE, BLOOD: Lipase: 26 U/L (ref 11–51)

## 2020-09-17 MED ORDER — ONDANSETRON HCL 4 MG/2ML IJ SOLN
4.0000 mg | Freq: Once | INTRAMUSCULAR | Status: AC
  Administered 2020-09-17: 4 mg via INTRAVENOUS
  Filled 2020-09-17: qty 2

## 2020-09-17 MED ORDER — SODIUM CHLORIDE 0.9 % IV SOLN
2.0000 g | Freq: Once | INTRAVENOUS | Status: AC
  Administered 2020-09-17: 2 g via INTRAVENOUS
  Filled 2020-09-17: qty 20

## 2020-09-17 MED ORDER — ONDANSETRON HCL 4 MG PO TABS: 4.0000 mg | ORAL_TABLET | Freq: Four times a day (QID) | ORAL | 0 refills | Status: DC

## 2020-09-17 MED ORDER — FENTANYL CITRATE (PF) 100 MCG/2ML IJ SOLN
50.0000 ug | Freq: Once | INTRAMUSCULAR | Status: AC
  Administered 2020-09-17: 50 ug via INTRAVENOUS
  Filled 2020-09-17: qty 2

## 2020-09-17 MED ORDER — NIRMATRELVIR/RITONAVIR (PAXLOVID)TABLET: 3.0000 | ORAL_TABLET | Freq: Two times a day (BID) | ORAL | 0 refills | Status: AC

## 2020-09-17 MED ORDER — METRONIDAZOLE 500 MG/100ML IV SOLN
500.0000 mg | Freq: Once | INTRAVENOUS | Status: AC
  Administered 2020-09-17: 500 mg via INTRAVENOUS
  Filled 2020-09-17: qty 100

## 2020-09-17 MED ORDER — ACETAMINOPHEN 500 MG PO TABS
1000.0000 mg | ORAL_TABLET | Freq: Once | ORAL | Status: AC
  Administered 2020-09-17: 1000 mg via ORAL
  Filled 2020-09-17: qty 2

## 2020-09-17 MED ORDER — LACTATED RINGERS IV BOLUS (SEPSIS)
1000.0000 mL | Freq: Once | INTRAVENOUS | Status: AC
  Administered 2020-09-17: 1000 mL via INTRAVENOUS

## 2020-09-17 MED ORDER — PANTOPRAZOLE SODIUM 20 MG PO TBEC: 20.0000 mg | DELAYED_RELEASE_TABLET | Freq: Every day | ORAL | 0 refills | Status: AC

## 2020-09-17 MED ORDER — CYCLOBENZAPRINE HCL 10 MG PO TABS: 10.0000 mg | ORAL_TABLET | Freq: Two times a day (BID) | ORAL | 0 refills | Status: DC | PRN

## 2020-09-17 MED ORDER — LACTATED RINGERS IV SOLN: INTRAVENOUS | Status: DC

## 2020-09-17 NOTE — ED Notes (Signed)
Pt aware that urine sample is needed.  

## 2020-09-17 NOTE — ED Notes (Signed)
Lactic 2.9. MD Schlossman notified.

## 2020-09-17 NOTE — ED Triage Notes (Signed)
Pt BIBA with c/o abdominal pain, fever and weakness and nausea since this morning. EMS gave 500 ml NS, 4mg  Zofran, 1000 mg tylenol  T-101 BP-144/90 HR-112 18 G left forearm [id number]

## 2020-09-17 NOTE — ED Provider Notes (Signed)
[facility] DEPT Provider Note   CSN: [id number] Arrival date & time: 09/17/20  1528     History Chief Complaint  Patient presents with  . Abdominal Pain  . Fever    Bryan Gray is a 40 y.o. male.  HPI     40 year old male with a history of diabetes presents with concern for abdominal pain, fever and generalized weakness.  Woke up feeling sick this morning.  Describes epigastric and right upper quadrant abdominal pain.  Has had associated nausea.  Denies vomiting.  Denies diarrhea.  Reports he has had some cough today as well.  Denies other congestion, sore throat.  No known sick contacts.  He has not been vaccinated against COVID-19.  The pain is severe.  Had fever beginning today as well.  Feels generally weak.   Past Medical History:  Diagnosis Date  . Diabetes mellitus without complication ([place])     There are no problems to display for this patient.   History reviewed. No pertinent surgical history.     No family history on file.  Social History   Tobacco Use  . Smoking status: Current Every Day Smoker    Packs/day: 1.00    Types: Cigarettes  . Smokeless tobacco: Never Used  Substance Use Topics  . Alcohol use: No  . Drug use: No    Home Medications Prior to Admission medications   Medication Sig Start Date End Date Taking? Authorizing Provider  cyclobenzaprine (FLEXERIL) 10 MG tablet Take 1 tablet (10 mg total) by mouth 2 (two) times daily as needed for muscle spasms. 09/17/20  Yes Gareth Morgan, MD  glipiZIDE (GLUCOTROL) 10 MG tablet Take 10 mg by mouth daily as needed (for high blood sugar level). 08/10/18  Yes [provider]  metFORMIN (GLUCOPHAGE) 500 MG tablet Take 1,000 mg by mouth 2 (two) times daily with a meal. 02/17/18  Yes [provider]  nirmatrelvir/ritonavir EUA (PAXLOVID) TABS Take 3 tablets by mouth 2 (two) times daily for 5 days. Patient GFR is 60. Take nirmatrelvir (150 mg) two  tablets twice daily for 5 days and ritonavir (100 mg) one tablet twice daily for 5 days. 09/17/20 09/22/20 Yes Gareth Morgan, MD  ondansetron (ZOFRAN) 4 MG tablet Take 1 tablet (4 mg total) by mouth every 6 (six) hours. 09/17/20  Yes Gareth Morgan, MD  pantoprazole (PROTONIX) 20 MG tablet Take 1 tablet (20 mg total) by mouth daily for 14 days. 09/17/20 10/01/20 Yes Gareth Morgan, MD  brompheniramine-pseudoephedrine-DM 30-2-10 MG/5ML syrup Take 5 mLs by mouth 4 (four) times daily as needed. Patient not taking: No sig reported 07/05/19   Johnn Hai, PA-C    Allergies    Patient has no known allergies.  Review of Systems   Review of Systems  Constitutional: Positive for appetite change, fatigue and fever.  HENT: Negative for sore throat.   Eyes: Negative for visual disturbance.  Respiratory: Positive for cough. Negative for shortness of breath.   Cardiovascular: Negative for chest pain.  Gastrointestinal: Positive for abdominal pain and nausea. Negative for constipation, diarrhea and vomiting.  Genitourinary: Negative for difficulty urinating.  Musculoskeletal: Positive for myalgias. Negative for back pain and neck stiffness.  Skin: Negative for rash.  Neurological: Positive for headaches. Negative for syncope.    Physical Exam Updated Vital Signs BP 117/67   Pulse 79   Temp 98.4 F (36.9 C) (Oral)   Resp (!) 27   Ht 6' (1.829 m)   Wt 108.9 kg  SpO2 100%   BMI 32.55 kg/m   Physical Exam Vitals and nursing note reviewed.  Constitutional:      General: He is not in acute distress.    Appearance: He is well-developed. He is ill-appearing. He is not diaphoretic.  HENT:     Head: Normocephalic and atraumatic.  Eyes:     Conjunctiva/sclera: Conjunctivae normal.  Cardiovascular:     Rate and Rhythm: Regular rhythm. Tachycardia present.     Heart sounds: Normal heart sounds. No murmur heard. No friction rub. No gallop.   Pulmonary:     Effort: Pulmonary effort is  normal. No respiratory distress.     Breath sounds: Normal breath sounds. No wheezing or rales.  Abdominal:     General: There is no distension.     Palpations: Abdomen is soft.     Tenderness: There is abdominal tenderness in the right upper quadrant and epigastric area. There is no guarding. Positive signs include Murphy's sign.  Musculoskeletal:     Cervical back: Normal range of motion.  Skin:    General: Skin is warm and dry.  Neurological:     Mental Status: He is alert and oriented to person, place, and time.     ED Results / Procedures / Treatments   Labs (all labs ordered are listed, but only abnormal results are displayed) Labs Reviewed  RESP PANEL BY RT-PCR (FLU A&B, COVID) ARPGX2 - Abnormal; Notable for the following components:      Result Value   SARS Coronavirus 2 by RT PCR POSITIVE (*)    All other components within normal limits  LACTIC ACID, PLASMA - Abnormal; Notable for the following components:   Lactic Acid, Venous 2.9 (*)    All other components within normal limits  COMPREHENSIVE METABOLIC PANEL - Abnormal; Notable for the following components:   Potassium 3.4 (*)    CO2 21 (*)    Glucose, Bld 365 (*)    Calcium 8.0 (*)    All other components within normal limits  CBC WITH DIFFERENTIAL/PLATELET - Abnormal; Notable for the following components:   RBC 3.92 (*)    Hemoglobin 12.9 (*)    HCT 38.5 (*)    Lymphs Abs 0.4 (*)    All other components within normal limits  URINALYSIS, ROUTINE W REFLEX MICROSCOPIC - Abnormal; Notable for the following components:   Glucose, UA >=500 (*)    All other components within normal limits  CULTURE, BLOOD (ROUTINE X 2)  CULTURE, BLOOD (ROUTINE X 2)  URINE CULTURE  LACTIC ACID, PLASMA  PROTIME-INR  APTT  LIPASE, BLOOD    EKG EKG Interpretation  Date/Time:  Monday Sep 17 2020 15:46:06 EDT Ventricular Rate:  106 PR Interval:  158 QRS Duration: 90 QT Interval:  346 QTC Calculation: 460 R Axis:   98 Text  Interpretation: Sinus tachycardia Borderline right axis deviation No previous ECGs available Confirmed by Gareth Morgan [id number]) on 09/17/2020 5:16:42 PM   Radiology DG Chest Port 1 View  Result Date: 09/17/2020 CLINICAL DATA:  Abdominal pain, fever, weakness and nausea since this morning, diabetes mellitus, smoker, question sepsis EXAM: PORTABLE CHEST 1 VIEW COMPARISON:  Portable exam 1702 hours without priors for comparison FINDINGS: Normal heart size, mediastinal contours, and pulmonary vascularity. Lungs clear. No acute infiltrate, pleural effusion, or pneumothorax. Osseous structures unremarkable. IMPRESSION: No acute abnormalities. Electronically Signed   By: Lavonia Dana M.D.   On: 09/17/2020 17:27   US Abdomen Limited RUQ (LIVER/GB)  Result Date: 09/17/2020 CLINICAL  DATA:  Chest pain and epigastric pain today, fever, history diabetes mellitus EXAM: ULTRASOUND ABDOMEN LIMITED RIGHT UPPER QUADRANT COMPARISON:  None FINDINGS: Gallbladder: Incompletely distended. Normal wall thickness. No gallstones, pericholecystic fluid, or sonographic Murphy sign. Common bile duct: Diameter: Normal caliber, 3 mm diameter Liver: Echogenic parenchyma, likely fatty infiltration though this can be seen with cirrhosis and certain infiltrative disorders. No focal hepatic mass or nodularity. Portal vein is patent on color Doppler imaging with normal direction of blood flow towards the liver. Other: No RIGHT upper quadrant free fluid. IMPRESSION: Probable fatty infiltration of liver as above. Otherwise negative exam. Electronically Signed   By: Lavonia Dana M.D.   On: 09/17/2020 17:29    Procedures Procedures   Medications Ordered in ED Medications  lactated ringers bolus 1,000 mL (0 mLs Intravenous Stopped 09/17/20 1712)  cefTRIAXone (ROCEPHIN) 2 g in sodium chloride 0.9 % 100 mL IVPB (0 g Intravenous Stopped 09/17/20 1712)  metroNIDAZOLE (FLAGYL) IVPB 500 mg (0 mg Intravenous Stopped 09/17/20 1818)  fentaNYL  (SUBLIMAZE) injection 50 mcg (50 mcg Intravenous Given 09/17/20 1628)  ondansetron (ZOFRAN) injection 4 mg (4 mg Intravenous Given 09/17/20 1628)  acetaminophen (TYLENOL) tablet 1,000 mg (1,000 mg Oral Given 09/17/20 1627)    ED Course  I have reviewed the triage vital signs and the nursing notes.  Pertinent labs & imaging results that were available during my care of the patient were reviewed by me and considered in my medical decision making (see chart for details).    MDM Rules/Calculators/A&P                          40 year old male with a history of diabetes presents with concern for abdominal pain, fever and generalized weakness.    Does not have lower abdominal pain, have low suspicion for appendicitis, diverticulitis.  Has tenderness in the epigastric and right upper quadrant.  Right upper ultrasound performed shows no evidence of cholecystitis or cholelithiasis.  Suspect that his epigastric pain is secondary to gastritis.  Lipase within normal limits, no signs of pancreatitis.  Chest x-ray shows no evidence of pneumonia.  Urinalysis shows no evidence of UTI.  Arrived initially tachycardic with fever of 102.  Given initial concern for possible intra-abdominal infection, with signs of sepsis, ordered antibiotics for suspected intra-abdominal infection and treated as sepsis.  Heart rate improved with hydration.  COVID-19 test returned positive.  Suspect this is likely etiology of his symptoms.  He has normal oxygenation on room air, remains hemodynamically stable, do not see indication for admission for COVID-19.  Given prescription for Paxlovid, zofran, PPI. Patient discharged in stable condition with understanding of reasons to return.    Final Clinical Impression(s) / ED Diagnoses Final diagnoses:  Epigastric pain  COVID-19    Rx / DC Orders ED Discharge Orders         Ordered    nirmatrelvir/ritonavir EUA (PAXLOVID) TABS  2 times daily        09/17/20 2006    ondansetron  (ZOFRAN) 4 MG tablet  Every 6 hours        09/17/20 2009    pantoprazole (PROTONIX) 20 MG tablet  Daily        09/17/20 2009    cyclobenzaprine (FLEXERIL) 10 MG tablet  2 times daily PRN        09/17/20 2009           Gareth Morgan, MD 09/18/20 1257

## 2020-09-19 LAB — URINE CULTURE: Culture: 30000 — AB

## 2020-09-20 ENCOUNTER — Telehealth: Payer: Self-pay | Admitting: *Deleted

## 2020-09-20 NOTE — Progress Notes (Signed)
ED Antimicrobial Stewardship Positive Culture Follow Up   Bryan Gray is an 40 y.o. male who presented to [facility] on 09/17/2020 with a chief complaint of  Chief Complaint  Patient presents with  . Abdominal Pain  . Fever    Recent Results (from the past 720 hour(s))  Urine culture     Status: Abnormal   Collection Time: 09/17/20  4:02 PM   Specimen: In/Out Cath Urine  Result Value Ref Range Status   Specimen Description   Final    IN/OUT CATH URINE Performed at [facility] [address] [place], [place] [address]    Special Requests   Final    NONE Performed at [facility], [place] [address], [place], [place] [address]    Culture (A)  Final    30,000 COLONIES/mL LACTOBACILLUS SPECIES Standardized susceptibility testing for this organism is not available. Performed at [facility], [place] [address] [place], [place] [address]    Report Status 09/19/2020 FINAL  Final  Blood Culture (routine x 2)     Status: None (Preliminary result)   Collection Time: 09/17/20  4:17 PM   Specimen: BLOOD LEFT FOREARM  Result Value Ref Range Status   Specimen Description   Final    BLOOD LEFT FOREARM Performed at [facility] [address], [place], [place] [address]    Special Requests   Final    BOTTLES DRAWN AEROBIC AND ANAEROBIC Blood Culture adequate volume Performed at [facility] [address] [place], [place] [address]    Culture   Final    NO GROWTH 2 DAYS Performed at [facility] [address], [place], [place] [address]    Report Status PENDING  Incomplete  Blood Culture (routine x 2)     Status: None (Preliminary result)   Collection Time: 09/17/20  4:40 PM   Specimen: BLOOD RIGHT FOREARM  Result Value Ref Range Status   Specimen Description   Final    BLOOD RIGHT FOREARM Performed at [facility] [address] [place], [place] [address]    Special  Requests   Final    BOTTLES DRAWN AEROBIC AND ANAEROBIC Blood Culture adequate volume Performed at [facility] [address] [place], [place] [address]    Culture   Final    NO GROWTH 2 DAYS Performed at [facility] [address], [place], [place] [address]    Report Status PENDING  Incomplete  Resp Panel by RT-PCR (Flu A&B, Covid) Nasopharyngeal Swab     Status: Abnormal   Collection Time: 09/17/20  5:06 PM   Specimen: Nasopharyngeal Swab; Nasopharyngeal(NP) swabs in vial transport medium  Result Value Ref Range Status   SARS Coronavirus 2 by RT PCR POSITIVE (A) NEGATIVE Final    Comment: RESULT CALLED TO, READ BACK BY AND VERIFIED WITH: S.WEST AT 1902 ON 09/17/2020 BY MOSLEY,J (NOTE) SARS-CoV-2 target nucleic acids are DETECTED.  The SARS-CoV-2 RNA is generally detectable in upper respiratory specimens during the acute phase of infection. Positive results are indicative of the presence of the identified virus, but do not rule out bacterial infection or co-infection with other pathogens not detected by the test. Clinical correlation with patient history and other diagnostic information is necessary to determine patient infection status. The expected result is Negative.  Fact Sheet for Patients: EntrepreneurPulse.com.au  Fact Sheet for Healthcare Providers: IncredibleEmployment.be  This test is not yet  approved or cleared by the [place] and  has been authorized for detection and/or diagnosis of SARS-CoV-2 by FDA under an Emergency Use Authorization (EUA).  This EUA will remain in effect (meaning this test  can be used) for the duration of  the COVID-19 declaration under Section 564(b)(1) of the Act, 21 U.S.C. section 360bbb-3(b)(1), unless the authorization is terminated or revoked sooner.     Influenza A by PCR NEGATIVE NEGATIVE Final   Influenza B by PCR NEGATIVE NEGATIVE Final    Comment:  (NOTE) The Xpert Xpress SARS-CoV-2/FLU/RSV plus assay is intended as an aid in the diagnosis of influenza from Nasopharyngeal swab specimens and should not be used as a sole basis for treatment. Nasal washings and aspirates are unacceptable for Xpert Xpress SARS-CoV-2/FLU/RSV testing.  Fact Sheet for Patients: EntrepreneurPulse.com.au  Fact Sheet for Healthcare Providers: IncredibleEmployment.be  This test is not yet approved or cleared by the [place] FDA and has been authorized for detection and/or diagnosis of SARS-CoV-2 by FDA under an Emergency Use Authorization (EUA). This EUA will remain in effect (meaning this test can be used) for the duration of the COVID-19 declaration under Section 564(b)(1) of the Act, 21 U.S.C. section 360bbb-3(b)(1), unless the authorization is terminated or revoked.  Performed at [facility], [place] [address] [place], [place] [address]    No urinary sx, insignificant growth of bacteria, no treatment necessary  ED Provider: Christ Kick, MD   Debara Pickett 09/20/2020, 8:49 AM Student Pharmacist

## 2020-09-20 NOTE — Telephone Encounter (Signed)
Post ED Visit - Positive Culture Follow-up  Culture report reviewed by antimicrobial stewardship pharmacist: [facility] Team []  Elenor Quinones, Pharm.D. []  Heide Guile, Pharm.D., BCPS AQ-ID []  Parks Neptune, Pharm.D., BCPS []  Alycia Rossetti, Pharm.D., BCPS []  [place], Pharm.D., BCPS, AAHIVP []  Legrand Como, Pharm.D., BCPS, AAHIVP []  Salome Arnt, PharmD, BCPS []  Johnnette Gourd, PharmD, BCPS []  Hughes Better, PharmD, BCPS []  Leeroy Cha, PharmD []  Laqueta Linden, PharmD, BCPS []  Albertina Parr, PharmD  [facility] Team []  Leodis Sias, PharmD []  Lindell Spar, PharmD []  Royetta Asal, PharmD []  Graylin Shiver, Rph []  Rema Fendt) Glennon Mac, PharmD []  Arlyn Dunning, PharmD []  Netta Cedars, PharmD []  Dia Sitter, PharmD []  Leone Haven, PharmD []  Gretta Arab, PharmD []  Theodis Shove, PharmD []  Peggyann Juba, PharmD []  Reuel Boom, PharmD   Positive urine culture No UTI symptoms, insignificant growth and no further patient follow-up is required at this time./Dr. Denzil Magnuson, Danny Lawless 09/20/2020, 9:35 AM

## 2020-09-22 LAB — CULTURE, BLOOD (ROUTINE X 2)
Culture: NO GROWTH
Culture: NO GROWTH
Special Requests: ADEQUATE
Special Requests: ADEQUATE

## 2020-11-28 ENCOUNTER — Encounter (HOSPITAL_COMMUNITY): Payer: Self-pay

## 2020-11-28 ENCOUNTER — Other Ambulatory Visit: Payer: Self-pay

## 2020-11-28 ENCOUNTER — Emergency Department (HOSPITAL_COMMUNITY)
Admission: EM | Admit: 2020-11-28 | Discharge: 2020-11-28 | Disposition: A | Discharge status: Home / Self Care | Payer: BC Managed Care – PPO | Attending: Emergency Medicine | Admitting: Emergency Medicine

## 2020-11-28 DIAGNOSIS — F17210 Nicotine dependence, cigarettes, uncomplicated: Secondary | ICD-10-CM | POA: Diagnosis not present

## 2020-11-28 DIAGNOSIS — X500XXA Overexertion from strenuous movement or load, initial encounter: Secondary | ICD-10-CM | POA: Insufficient documentation

## 2020-11-28 DIAGNOSIS — E119 Type 2 diabetes mellitus without complications: Secondary | ICD-10-CM | POA: Insufficient documentation

## 2020-11-28 DIAGNOSIS — S39012A Strain of muscle, fascia and tendon of lower back, initial encounter: Secondary | ICD-10-CM | POA: Diagnosis not present

## 2020-11-28 DIAGNOSIS — T148XXA Other injury of unspecified body region, initial encounter: Secondary | ICD-10-CM

## 2020-11-28 DIAGNOSIS — Z7984 Long term (current) use of oral hypoglycemic drugs: Secondary | ICD-10-CM | POA: Diagnosis not present

## 2020-11-28 DIAGNOSIS — S3992XA Unspecified injury of lower back, initial encounter: Secondary | ICD-10-CM | POA: Diagnosis present

## 2020-11-28 DIAGNOSIS — M545 Low back pain, unspecified: Secondary | ICD-10-CM

## 2020-11-28 MED ORDER — NAPROXEN 500 MG PO TABS: 500.0000 mg | ORAL_TABLET | Freq: Two times a day (BID) | ORAL | 0 refills | Status: DC

## 2020-11-28 MED ORDER — METHOCARBAMOL 500 MG PO TABS: 500.0000 mg | ORAL_TABLET | Freq: Two times a day (BID) | ORAL | 0 refills | Status: DC

## 2020-11-28 NOTE — Discharge Instructions (Signed)
Please pick up medications and take as prescribed. I would recommend taking the anti-inflammatories during the day and the muscle relaxer at nighttime. DO NOT DRIVE OR DRINK ALCOHOL WHILE ON THE MUSCLE RELAXERS.   I would recommend ice to your back over the next 48 hours with transition to heat after that if your pain continues.   You can also buy OTC Salon Pas patches to place on your back.   Follow up with your PCP regarding ED visit today  Return to the ED for any new/worsening symptoms

## 2020-11-28 NOTE — ED Triage Notes (Signed)
Pt states that he was picking up some boxes tonight and heard a pop in his back. Pt complains of lower back pain 9/10.

## 2020-11-28 NOTE — ED Provider Notes (Signed)
[facility] DEPT Provider Note   CSN: [id number] Arrival date & time: 11/28/20  2025     History Chief Complaint  Patient presents with   Back Pain    Bryan Gray is a 40 y.o. male with PMHx diabetes who presents to the ED today with complaint of sudden onset, constant, achy, left lower back pain that began earlier this morning.  Patient states that he was lifting about a 50 pound box earlier today and he felt a sudden onset of pain in his left lower back.  He states that he had a weird sensation in his body at that time however it dissipated.  He denies any weakness/numbness/tingling in his lower extremities since that time.  He has not taken anything for pain.  He states that he was not sure if he can go to work tomorrow.-Does do quite a lot of heavy lifting at work including heavier than 50 pound boxes.  He denies any fevers/chills, urinary retention, urinary or bowel incontinence, saddle anesthesia, any other associated symptoms.  No previous surgeries to back.  The history is provided by the patient and medical records.      Past Medical History:  Diagnosis Date   Diabetes mellitus without complication ([place])     There are no problems to display for this patient.   History reviewed. No pertinent surgical history.     History reviewed. No pertinent family history.  Social History   Tobacco Use   Smoking status: Every Day    Packs/day: 1.00    Types: Cigarettes   Smokeless tobacco: Never  Substance Use Topics   Alcohol use: No   Drug use: No    Home Medications Prior to Admission medications   Medication Sig Start Date End Date Taking? Authorizing Provider  methocarbamol (ROBAXIN) 500 MG tablet Take 1 tablet (500 mg total) by mouth 2 (two) times daily. 11/28/20  Yes Reinhardt Licausi, PA-C  naproxen (NAPROSYN) 500 MG tablet Take 1 tablet (500 mg total) by mouth 2 (two) times daily. 11/28/20  Yes Timber Lucarelli, PA-C   brompheniramine-pseudoephedrine-DM 30-2-10 MG/5ML syrup Take 5 mLs by mouth 4 (four) times daily as needed. Patient not taking: No sig reported 07/05/19   Johnn Hai, PA-C  cyclobenzaprine (FLEXERIL) 10 MG tablet Take 1 tablet (10 mg total) by mouth 2 (two) times daily as needed for muscle spasms. 09/17/20   Gareth Morgan, MD  glipiZIDE (GLUCOTROL) 10 MG tablet Take 10 mg by mouth daily as needed (for high blood sugar level). 08/10/18   [provider]  metFORMIN (GLUCOPHAGE) 500 MG tablet Take 1,000 mg by mouth 2 (two) times daily with a meal. 02/17/18   [provider]  ondansetron (ZOFRAN) 4 MG tablet Take 1 tablet (4 mg total) by mouth every 6 (six) hours. 09/17/20   Gareth Morgan, MD  pantoprazole (PROTONIX) 20 MG tablet Take 1 tablet (20 mg total) by mouth daily for 14 days. 09/17/20 10/01/20  Gareth Morgan, MD    Allergies    Patient has no known allergies.  Review of Systems   Review of Systems  Constitutional:  Negative for chills and fever.  Genitourinary:  Negative for difficulty urinating.  Musculoskeletal:  Positive for arthralgias.  Neurological:  Negative for weakness and numbness.  All other systems reviewed and are negative.  Physical Exam Updated Vital Signs BP (!) 152/81 (BP Location: Left Arm)   Pulse 85   Temp 98.1 F (36.7 C) (Oral)   Resp 18  SpO2 99%   Physical Exam Vitals and nursing note reviewed.  Constitutional:      Appearance: He is not ill-appearing.  HENT:     Head: Normocephalic and atraumatic.  Eyes:     Conjunctiva/sclera: Conjunctivae normal.  Cardiovascular:     Rate and Rhythm: Normal rate and regular rhythm.     Pulses: Normal pulses.  Pulmonary:     Effort: Pulmonary effort is normal.     Breath sounds: Normal breath sounds. No wheezing, rhonchi or rales.  Musculoskeletal:     Comments: No C, T, or L midline spinal TTP. + left lumbar paramusculature TTP. ROM intact to neck and back. Strength 5/5 to BLE  and BLEs. Sensation intact throughout. 2+ PT pulses bilaterally.   Skin:    General: Skin is warm and dry.     Coloration: Skin is not jaundiced.  Neurological:     Mental Status: He is alert.    ED Results / Procedures / Treatments   Labs (all labs ordered are listed, but only abnormal results are displayed) Labs Reviewed - No data to display  EKG None  Radiology No results found.  Procedures Procedures   Medications Ordered in ED Medications - No data to display  ED Course  I have reviewed the triage vital signs and the nursing notes.  Pertinent labs & imaging results that were available during my care of the patient were reviewed by me and considered in my medical decision making (see chart for details).    MDM Rules/Calculators/A&P                           39 year old male who presents to the ED today with complaint of sudden onset lower back pain after lifting a 50 pound box earlier today.  Verbal to the ED vitals are stable patient appears to be in no acute distress.  On my exam he has no midline spinal tenderness palpation.  He does have left lumbar para musculature tenderness palpation.  He is neurovascularly intact throughout.  He has no red flag symptoms today concerning for cauda equina, spinal epidural abscess, AAA.  He denies any radiation of pain down his legs to suggest sciatica or herniated disc.  Suspect muscle strain at this time.  Will discharge home with Robaxin and naproxen and PCP follow-up.  Patient in agreement with plan and stable for discharge.   This note was prepared using Dragon voice recognition software and may include unintentional dictation errors due to the inherent limitations of voice recognition software.   Final Clinical Impression(s) / ED Diagnoses Final diagnoses:  Acute left-sided low back pain without sciatica  Muscle strain    Rx / DC Orders ED Discharge Orders          Ordered    methocarbamol (ROBAXIN) 500 MG tablet  2  times daily        11/28/20 2151    naproxen (NAPROSYN) 500 MG tablet  2 times daily        11/28/20 2151             Discharge Instructions      Please pick up medications and take as prescribed. I would recommend taking the anti-inflammatories during the day and the muscle relaxer at nighttime. DO NOT DRIVE OR DRINK ALCOHOL WHILE ON THE MUSCLE RELAXERS.   I would recommend ice to your back over the next 48 hours with transition to heat after that if  your pain continues.   You can also buy OTC Salon Pas patches to place on your back.   Follow up with your PCP regarding ED visit today  Return to the ED for any new/worsening symptoms       Eustaquio Maize, PA-C 11/28/20 2153    Varney Biles, MD 11/29/20 1513

## 2021-02-25 ENCOUNTER — Encounter: Payer: Self-pay | Admitting: Emergency Medicine

## 2021-02-25 ENCOUNTER — Emergency Department
Admission: EM | Admit: 2021-02-25 | Discharge: 2021-02-25 | Disposition: A | Discharge status: Home / Self Care | Payer: BC Managed Care – PPO | Attending: Emergency Medicine | Admitting: Emergency Medicine

## 2021-02-25 ENCOUNTER — Other Ambulatory Visit: Payer: Self-pay

## 2021-02-25 DIAGNOSIS — T782XXA Anaphylactic shock, unspecified, initial encounter: Secondary | ICD-10-CM | POA: Insufficient documentation

## 2021-02-25 DIAGNOSIS — E876 Hypokalemia: Secondary | ICD-10-CM | POA: Insufficient documentation

## 2021-02-25 DIAGNOSIS — E1165 Type 2 diabetes mellitus with hyperglycemia: Secondary | ICD-10-CM | POA: Insufficient documentation

## 2021-02-25 DIAGNOSIS — R739 Hyperglycemia, unspecified: Secondary | ICD-10-CM

## 2021-02-25 DIAGNOSIS — Z7984 Long term (current) use of oral hypoglycemic drugs: Secondary | ICD-10-CM | POA: Insufficient documentation

## 2021-02-25 DIAGNOSIS — F17210 Nicotine dependence, cigarettes, uncomplicated: Secondary | ICD-10-CM | POA: Insufficient documentation

## 2021-02-25 LAB — BASIC METABOLIC PANEL
Anion gap: 8 (ref 5–15)
BUN: 14 mg/dL (ref 6–20)
CO2: 25 mmol/L (ref 22–32)
Calcium: 8.5 mg/dL — ABNORMAL LOW (ref 8.9–10.3)
Chloride: 102 mmol/L (ref 98–111)
Creatinine, Ser: 1.11 mg/dL (ref 0.61–1.24)
GFR, Estimated: >60 mL/min (ref >60)
Glucose, Bld: 479 mg/dL — ABNORMAL HIGH (ref 70–99)
Potassium: 3.2 mmol/L — ABNORMAL LOW (ref 3.5–5.1)
Sodium: 135 mmol/L (ref 135–145)

## 2021-02-25 MED ORDER — INSULIN ASPART 100 UNIT/ML IJ SOLN
0.0000 [IU] | INTRAMUSCULAR | Status: DC
  Administered 2021-02-25: 15 [IU] via SUBCUTANEOUS
  Filled 2021-02-25: qty 1

## 2021-02-25 MED ORDER — FAMOTIDINE IN NACL 20-0.9 MG/50ML-% IV SOLN
20.0000 mg | Freq: Once | INTRAVENOUS | Status: AC
  Administered 2021-02-25: 20 mg via INTRAVENOUS
  Filled 2021-02-25: qty 50

## 2021-02-25 MED ORDER — POTASSIUM CHLORIDE CRYS ER 20 MEQ PO TBCR
40.0000 meq | EXTENDED_RELEASE_TABLET | Freq: Once | ORAL | Status: AC
  Administered 2021-02-25: 40 meq via ORAL
  Filled 2021-02-25: qty 2

## 2021-02-25 MED ORDER — LACTATED RINGERS IV BOLUS
1000.0000 mL | Freq: Once | INTRAVENOUS | Status: AC
  Administered 2021-02-25: 1000 mL via INTRAVENOUS

## 2021-02-25 MED ORDER — EPINEPHRINE 0.3 MG/0.3ML IJ SOAJ: 0.3000 mg | Freq: Once | INTRAMUSCULAR | 0 refills | Status: AC | PRN

## 2021-02-25 MED ORDER — ONDANSETRON HCL 4 MG/2ML IJ SOLN
4.0000 mg | Freq: Once | INTRAMUSCULAR | Status: AC
  Administered 2021-02-25: 4 mg via INTRAVENOUS
  Filled 2021-02-25: qty 2

## 2021-02-25 MED ORDER — METHYLPREDNISOLONE SODIUM SUCC 125 MG IJ SOLR
125.0000 mg | Freq: Once | INTRAMUSCULAR | Status: AC
  Administered 2021-02-25: 125 mg via INTRAVENOUS
  Filled 2021-02-25: qty 2

## 2021-02-25 NOTE — ED Provider Notes (Signed)
[facility] Emergency Department Provider Note  ____________________________________________   Event Date/Time   First MD Initiated Contact with Patient 02/25/21 1518     (approximate)  I have reviewed the triage vital signs and the nursing notes.   HISTORY  Chief Complaint Allergic Reaction   HPI Bryan Gray is a 40 y.o. male with a past medical history of diabetes who presents via EMS with concerns for anaphylactic reaction to ant bites in his left foot.  Per patient he thinks he possibly was fire ants and was on his way to the hospital when his wife called EMS when patient started getting more sleepy and developing more shortness of breath.  Patient states he is an itchy rash everywhere and is nauseous and feeling a little short of breath but feels better after he was 0.3 mg of IM epinephrine and 50 mg of Solu-Medrol with EMS.  He states he has never had a reaction like this to answer or prior anaphylactic reactions.  He states that prior to this incident he is in his usual state of health without any recent fevers, chills, cough, nausea, vomiting, diarrhea, dysuria, rash or recent injuries or falls.  Denies any illicit drug use.  No other acute concerns at this time.         Past Medical History:  Diagnosis Date   Diabetes mellitus without complication ([place])     There are no problems to display for this patient.   History reviewed. No pertinent surgical history.  Prior to Admission medications   Medication Sig Start Date End Date Taking? Authorizing Provider  EPINEPHrine 0.3 mg/0.3 mL IJ SOAJ injection Inject 0.3 mg into the muscle once as needed for up to 1 dose for anaphylaxis. 02/25/21  Yes Lucrezia Starch, MD  glipiZIDE (GLUCOTROL) 10 MG tablet Take 10 mg by mouth daily as needed (for high blood sugar level). 08/10/18   [provider]  metFORMIN (GLUCOPHAGE) 500 MG tablet Take 1,000 mg by mouth 2 (two) times daily with a meal.  02/17/18   [provider]  pantoprazole (PROTONIX) 20 MG tablet Take 1 tablet (20 mg total) by mouth daily for 14 days. 09/17/20 10/01/20  Gareth Morgan, MD    Allergies Patient has no known allergies.  History reviewed. No pertinent family history.  Social History Social History   Tobacco Use   Smoking status: Every Day    Packs/day: 1.00    Types: Cigarettes   Smokeless tobacco: Never  Substance Use Topics   Alcohol use: No   Drug use: No    Review of Systems  Review of Systems  Constitutional:  Negative for chills and fever.  HENT:  Negative for sore throat.   Eyes:  Negative for pain.  Respiratory:  Positive for shortness of breath. Negative for cough and stridor.   Cardiovascular:  Negative for chest pain.  Gastrointestinal:  Positive for nausea. Negative for vomiting.  Genitourinary:  Negative for dysuria.  Musculoskeletal:  Positive for myalgias (L foot).  Skin:  Positive for itching and rash.  Neurological:  Negative for seizures, loss of consciousness and headaches.  Psychiatric/Behavioral:  Negative for suicidal ideas.   All other systems reviewed and are negative.    ____________________________________________   PHYSICAL EXAM:  VITAL SIGNS: ED Triage Vitals  Enc Vitals Group     BP      Pulse      Resp      Temp      Temp  src      SpO2      Weight      Height      Head Circumference      Peak Flow      Pain Score      Pain Loc      Pain Edu?      Excl. in [place]?    Vitals:   02/25/21 1700 02/25/21 1830  BP: 130/79 106/68  Pulse: 100 97  Resp: 15 (!) 23  Temp:    SpO2: 98% 97%   Physical Exam Vitals and nursing note reviewed.  Constitutional:      Appearance: He is well-developed.  HENT:     Head: Normocephalic and atraumatic.     Right Ear: External ear normal.     Left Ear: External ear normal.     Nose: Nose normal.  Eyes:     Conjunctiva/sclera: Conjunctivae normal.  Cardiovascular:     Rate and Rhythm: Regular  rhythm. Tachycardia present.     Heart sounds: No murmur heard. Pulmonary:     Effort: Pulmonary effort is normal. Tachypnea present. No respiratory distress.     Breath sounds: Normal breath sounds.  Abdominal:     Palpations: Abdomen is soft.     Tenderness: There is no abdominal tenderness.  Musculoskeletal:     Cervical back: Neck supple.  Skin:    General: Skin is warm and dry.     Findings: Erythema and rash present. Rash is macular, papular and urticarial.  Neurological:     Mental Status: He is alert and oriented to person, place, and time.  Psychiatric:        Mood and Affect: Mood normal.    Oropharynx is unremarkable.  There is no stridor over the neck.  No significant wheezing on  exam of the lungs.  There is some mild periorbital edema and a papular macular rash over the bilateral upper extremities.  There is also edema in the left foot up to the ankle with some erythema and a few areas scattered less than 0.25 cm oval in shape over the dorsum of the foot patient reports he was stung.  No visible stingers.  Abdomen is soft nontender throughout. ____________________________________________   LABS (all labs ordered are listed, but only abnormal results are displayed)  Labs Reviewed  BASIC METABOLIC PANEL - Abnormal; Notable for the following components:      Result Value   Potassium 3.2 (*)    Glucose, Bld 479 (*)    Calcium 8.5 (*)    All other components within normal limits  HEMOGLOBIN A1C   ____________________________________________  EKG   ____________________________________________  RADIOLOGY  ED MD interpretation:    Official radiology report(s): No results found.  ____________________________________________   PROCEDURES  Procedure(s) performed (including Critical Care):  .1-3 Lead EKG Interpretation Performed by: Lucrezia Starch, MD Authorized by: Lucrezia Starch, MD     Interpretation: non-specific     ECG rate assessment: normal      Rhythm: sinus rhythm     Ectopy: none     Conduction: normal     ____________________________________________   INITIAL IMPRESSION / ASSESSMENT AND PLAN / ED COURSE     Patient presents with above-stated history exam for assessment of some shortness of breath, nausea, itchy rash and swelling around both eyes that began after he was stung by some ants in his left foot shortly prior to arrival.  He did receive epinephrine Benadryl and some fluids  by EMS prior to arrival.  On arrival patient is tachycardic at 105, tachypneic at 25 with otherwise stable vital signs on room air.  Exam as above remarkable for some urticaria but no stridor or wheezing patient otherwise alert and oriented and stating he is feeling better after receiving MS medications.  His left foot is little bit erythematous but he is denying any pain he declines any analgesia.  There are no visible stingers in the foot.  Overall impression is of an acute anaphylactic reaction to ant stings.  At this time I have a very low suspicion for any recent or preceding trauma, acute infectious process, metabolic derangements or other immediate life-threatening process.  I will plan to observe for 4 hours given patient did receive epinephrine from the time of his epinephrine administration approximately 30 minutes prior to arrival.  On several assessments patient stated he continued to feel better and denies any new symptoms.  He had no wheezing, stridor, increased work of breathing or any other concerning findings on several reassessments necessitating repeat administration of epinephrine.  BMP obtained did show hyperglycemia with a glucose of 479 and K of 3.2.  Patient given some fluids potassium and small dose of insulin advised he must have his sugar rechecked in a couple days she may need his regiment readjusted.  There is no evidence of acidosis or DKA I think he is otherwise stable for discharge close outpatient PCP follow-up.  Discharged in  stable condition.  Strict return precautions advised and discussed.  Rx for EpiPen written and discussed calling 911 immediately if he feels he needs to use it or experiences any new or acute worsening of symptoms.         ____________________________________________   FINAL CLINICAL IMPRESSION(S) / ED DIAGNOSES  Final diagnoses:  Anaphylaxis, initial encounter  Hyperglycemia  Hypokalemia    Medications  insulin aspart (novoLOG) injection 0-15 Units (15 Units Subcutaneous Given 02/25/21 1649)  methylPREDNISolone sodium succinate (SOLU-MEDROL) 125 mg/2 mL injection 125 mg (125 mg Intravenous Given 02/25/21 1537)  lactated ringers bolus 1,000 mL (0 mLs Intravenous Stopped 02/25/21 1653)  famotidine (PEPCID) IVPB 20 mg premix (0 mg Intravenous Stopped 02/25/21 1652)  ondansetron (ZOFRAN) injection 4 mg (4 mg Intravenous Given 02/25/21 1537)  potassium chloride SA (KLOR-CON) CR tablet 40 mEq (40 mEq Oral Given 02/25/21 1648)  lactated ringers bolus 1,000 mL (1,000 mLs Intravenous New Bag/Given 02/25/21 1817)     ED Discharge Orders          Ordered    EPINEPHrine 0.3 mg/0.3 mL IJ SOAJ injection  Once PRN        02/25/21 1617             Note:  This document was prepared using Dragon voice recognition software and may include unintentional dictation errors.    Lucrezia Starch, MD 02/25/21 1928

## 2021-02-25 NOTE — Discharge Instructions (Addendum)
Your blood pressure your blood sugar was very elevated today.  I suspect this likely is the setting of poorly controlled sugars with your metformin but likely worse with your medicines for urine collection reaction today.  Please have your sugars rechecked in 3 to 5 days by your PCP.

## 2021-02-25 NOTE — ED Triage Notes (Signed)
Per EMS, pt was in the yard doing yard work and was bite by Physiological scientist. Pt became SOB, hives, and swollen eyes. Pt states they have been bit before and it never happen. EMS gave 0.3 of EPI, 50 of benadryl, and 168mL of NS. Pt is still c/o of SOB. Pt is A&O x4.

## 2021-02-26 LAB — HEMOGLOBIN A1C
Hgb A1c MFr Bld: 8.9 % — ABNORMAL HIGH (ref 4.8–5.6)
Mean Plasma Glucose: 208.73 mg/dL

## 2021-03-05 ENCOUNTER — Other Ambulatory Visit: Payer: Self-pay | Admitting: Otolaryngology

## 2021-03-05 ENCOUNTER — Other Ambulatory Visit: Payer: Self-pay | Admitting: Family Medicine

## 2021-03-05 DIAGNOSIS — D170 Benign lipomatous neoplasm of skin and subcutaneous tissue of head, face and neck: Secondary | ICD-10-CM

## 2021-03-06 ENCOUNTER — Ambulatory Visit
Admission: RE | Admit: 2021-03-06 | Discharge: 2021-03-06 | Disposition: A | Discharge status: Home / Self Care | Payer: BC Managed Care – PPO | Source: Ambulatory Visit | Attending: Otolaryngology | Admitting: Otolaryngology

## 2021-03-06 DIAGNOSIS — D170 Benign lipomatous neoplasm of skin and subcutaneous tissue of head, face and neck: Secondary | ICD-10-CM

## 2021-03-06 MED ORDER — IOPAMIDOL (ISOVUE-300) INJECTION 61%
75.0000 mL | Freq: Once | INTRAVENOUS | Status: AC | PRN
  Administered 2021-03-06: 75 mL via INTRAVENOUS

## 2021-07-23 IMAGING — US US ABDOMEN LIMITED
1 series · 14 of 25 positions shown · non-contrast
Comparison: None

CLINICAL DATA: Chest pain and epigastric pain today, fever, history
diabetes mellitus

EXAM:
ULTRASOUND ABDOMEN LIMITED RIGHT UPPER QUADRANT

[Series 1: us abdomen limited · 14 of 25 slices shown]
[im 1/25]
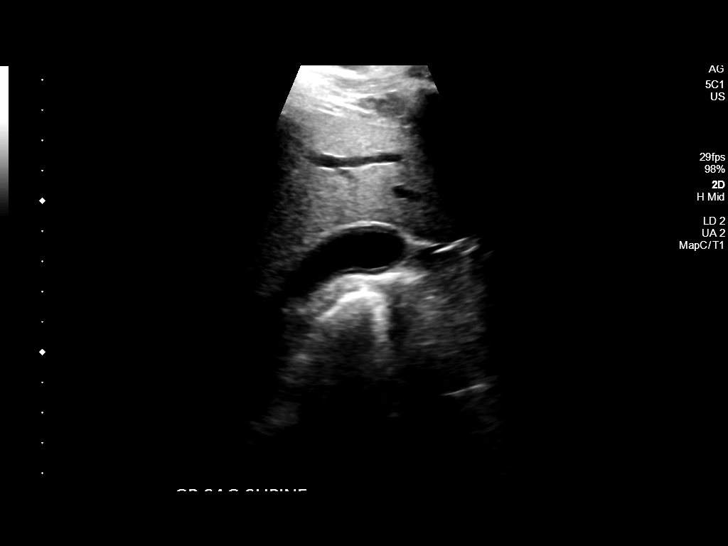
[im 3/25]
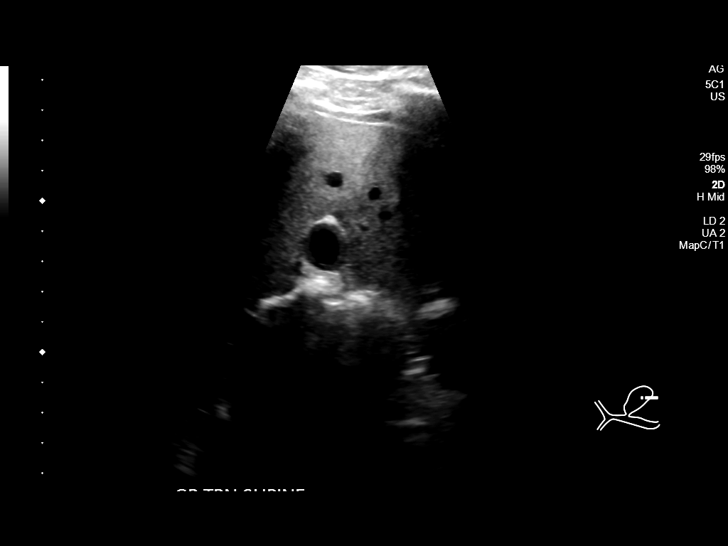
[im 5/25]
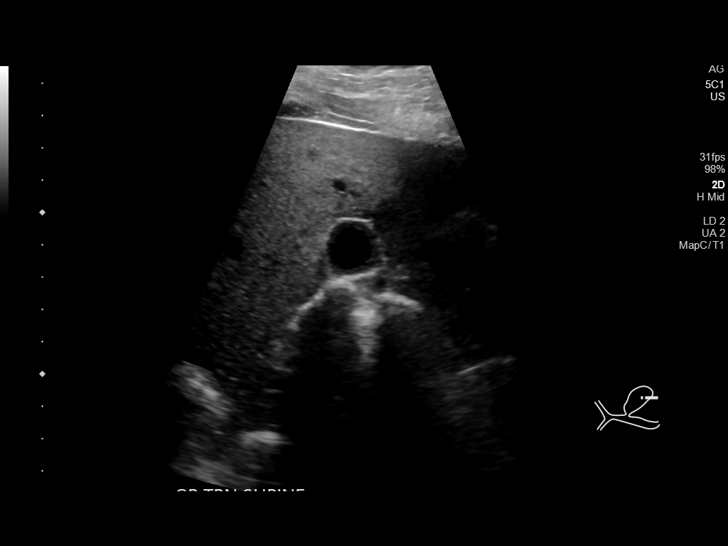
[im 7/25]
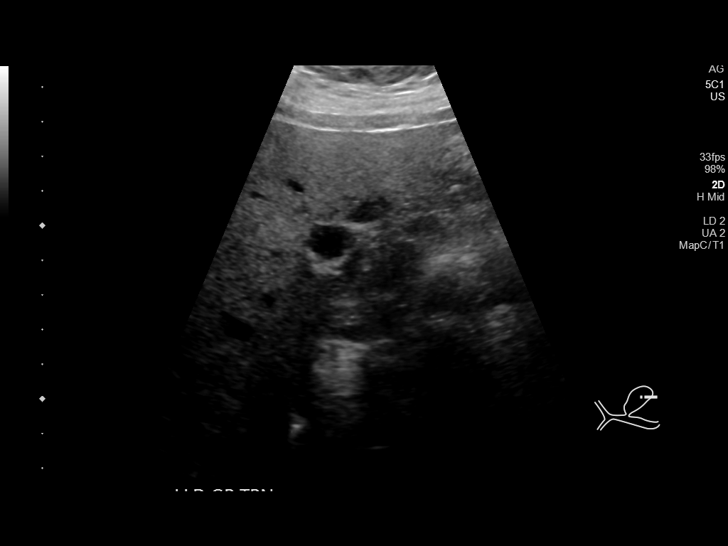
[im 9/25]
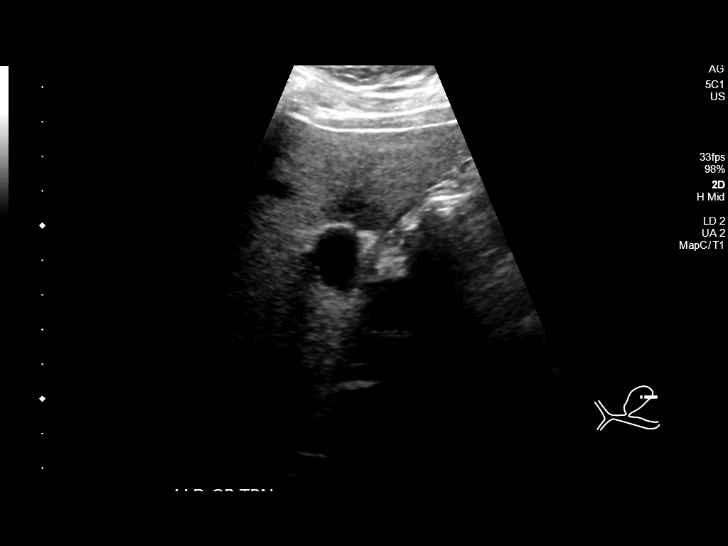
[im 10/25]
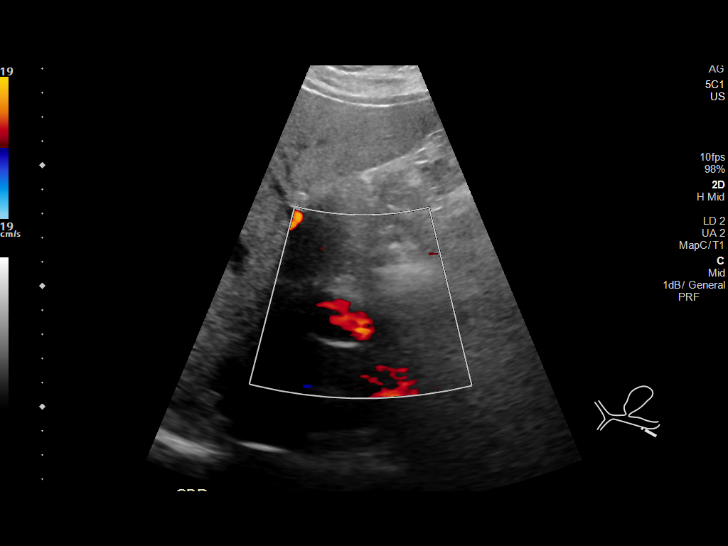
[im 12/25]
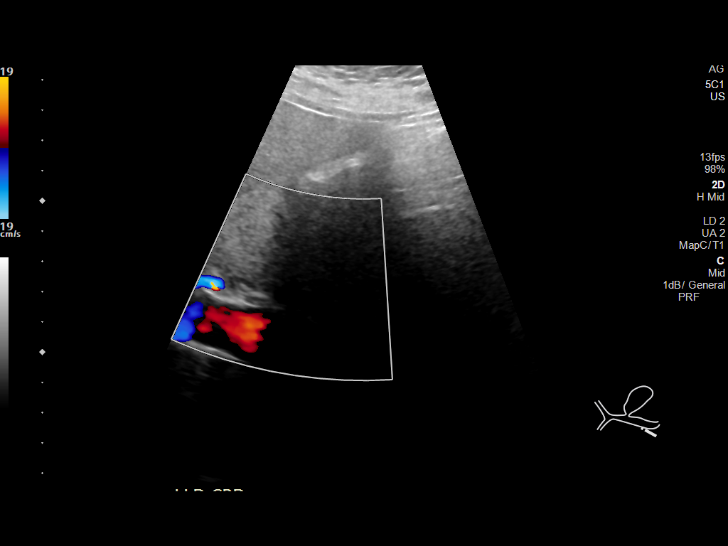
[im 14/25]
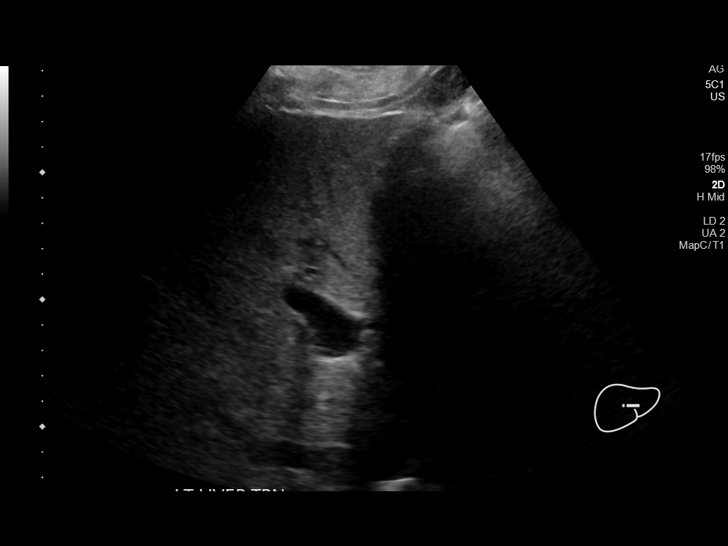
[im 16/25]
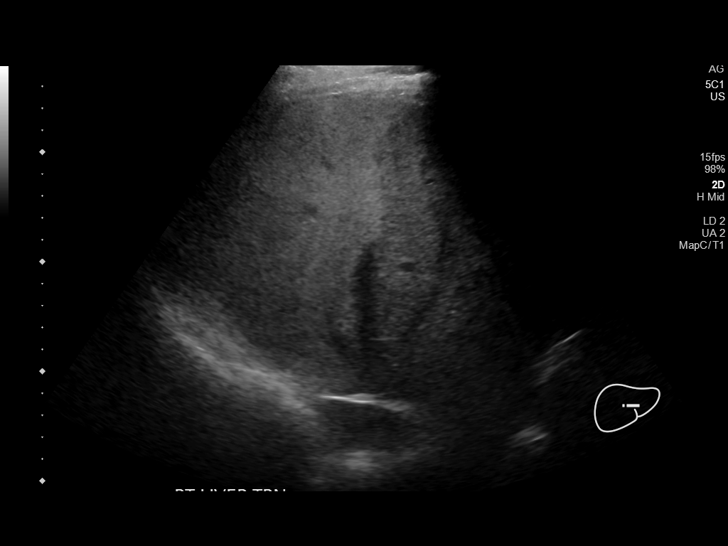
[im 17/25]
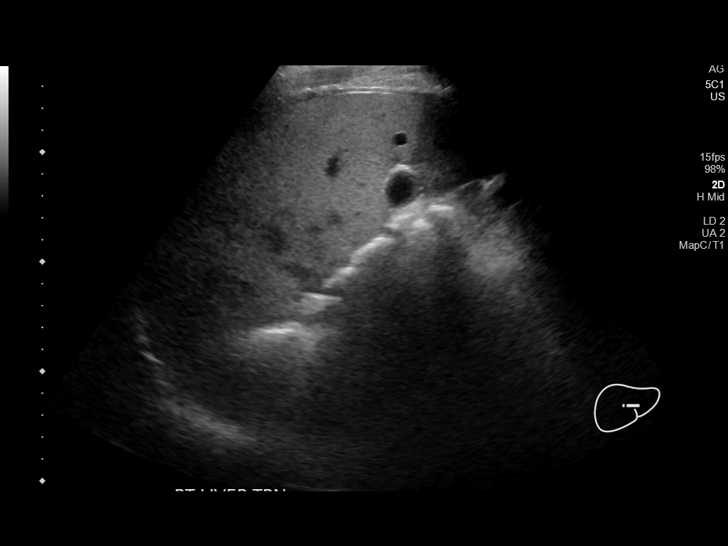
[im 19/25]
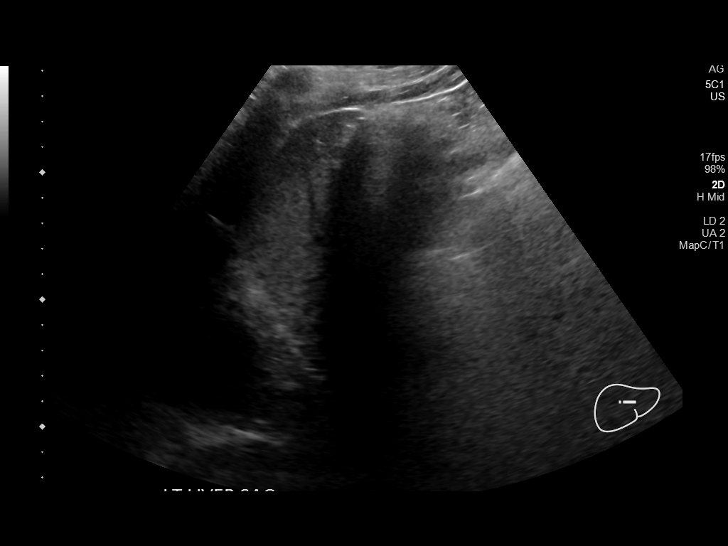
[im 21/25]
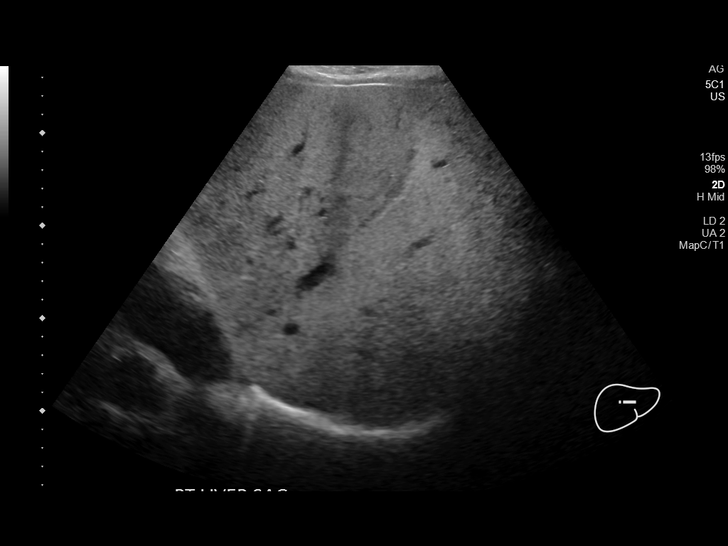
[im 23/25]
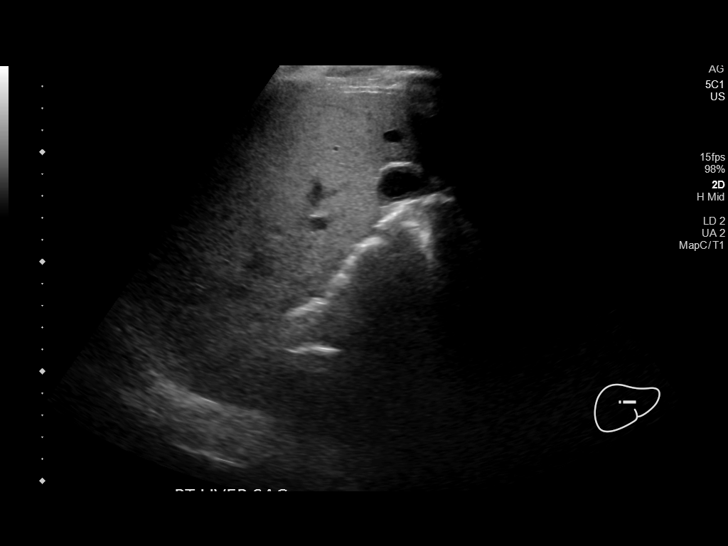
[im 25/25]
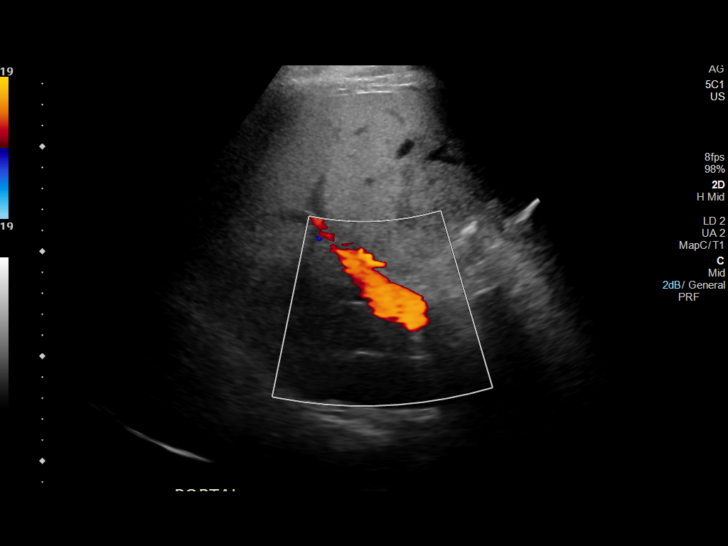

[14 of 25 positions shown; findings below may reference images not displayed]

FINDINGS: Gallbladder:

Incompletely distended. Normal wall thickness. No gallstones,
pericholecystic fluid, or sonographic Murphy sign.

Common bile duct:

Diameter: Normal caliber, 3 mm diameter

Liver:

Echogenic parenchyma, likely fatty infiltration though this can be
seen with cirrhosis and certain infiltrative disorders. No focal
hepatic mass or nodularity. Portal vein is patent on color Doppler
imaging with normal direction of blood flow towards the liver.

Other: No RIGHT upper quadrant free fluid.
IMPRESSION: Probable fatty infiltration of liver as above.

Otherwise negative exam.

## 2022-03-25 ENCOUNTER — Ambulatory Visit (HOSPITAL_COMMUNITY)
Admission: EM | Admit: 2022-03-25 | Discharge: 2022-03-25 | Disposition: A | Discharge status: Home / Self Care | Payer: Managed Care, Other (non HMO) | Attending: Emergency Medicine | Admitting: Emergency Medicine

## 2022-03-25 ENCOUNTER — Encounter (HOSPITAL_COMMUNITY): Payer: Self-pay

## 2022-03-25 DIAGNOSIS — Z20822 Contact with and (suspected) exposure to covid-19: Secondary | ICD-10-CM | POA: Insufficient documentation

## 2022-03-25 DIAGNOSIS — Z1152 Encounter for screening for COVID-19: Secondary | ICD-10-CM | POA: Insufficient documentation

## 2022-03-25 NOTE — ED Provider Notes (Signed)
[facility]    CSN: [id number] Arrival date & time: 03/25/22  1310      History   Chief Complaint Chief Complaint  Patient presents with   covid testing    HPI MIACHEL NARDELLI is a 41 y.o. male.  Presents for COVID testing Reports close exposure to positive individual 3 days ago He denies any current symptoms.  Would like testing for work.  Past Medical History:  Diagnosis Date   Diabetes mellitus without complication ([place])     There are no problems to display for this patient.   History reviewed. No pertinent surgical history.     Home Medications    Prior to Admission medications   Medication Sig Start Date End Date Taking? Authorizing Provider  EPINEPHrine 0.3 mg/0.3 mL IJ SOAJ injection Inject 0.3 mg into the muscle once as needed for up to 1 dose for anaphylaxis. 02/25/21   Lucrezia Starch, MD  glipiZIDE (GLUCOTROL) 10 MG tablet Take 10 mg by mouth daily as needed (for high blood sugar level). 08/10/18   [provider]  metFORMIN (GLUCOPHAGE) 500 MG tablet Take 1,000 mg by mouth 2 (two) times daily with a meal. 02/17/18   [provider]  pantoprazole (PROTONIX) 20 MG tablet Take 1 tablet (20 mg total) by mouth daily for 14 days. 09/17/20 10/01/20  Gareth Morgan, MD    Family History History reviewed. No pertinent family history.  Social History Social History   Tobacco Use   Smoking status: Every Day    Packs/day: 1.00    Types: Cigarettes   Smokeless tobacco: Never  Substance Use Topics   Alcohol use: No   Drug use: No     Allergies   Patient has no known allergies.   Review of Systems Review of Systems Negative as per HPI  Physical Exam Triage Vital Signs ED Triage Vitals  Enc Vitals Group     BP 03/25/22 1457 (!) 142/85     Pulse Rate 03/25/22 1457 79     Resp 03/25/22 1457 18     Temp 03/25/22 1457 98.2 F (36.8 C)     Temp Source 03/25/22 1457 Oral     SpO2 03/25/22 1457 100 %     Weight --       Height --      Head Circumference --      Peak Flow --      Pain Score 03/25/22 1458 0     Pain Loc --      Pain Edu? --      Excl. in [place]? --    No data found.  Updated Vital Signs BP (!) 142/85 (BP Location: Left Arm)   Pulse 79   Temp 98.2 F (36.8 C) (Oral)   Resp 18   SpO2 100%   Visual Acuity Right Eye Distance:   Left Eye Distance:   Bilateral Distance:    Right Eye Near:   Left Eye Near:    Bilateral Near:     Physical Exam Vitals and nursing note reviewed.  Constitutional:      General: He is not in acute distress.    Appearance: Normal appearance.  HENT:     Mouth/Throat:     Pharynx: Oropharynx is clear.  Cardiovascular:     Rate and Rhythm: Normal rate and regular rhythm.     Pulses: Normal pulses.  Pulmonary:     Effort: Pulmonary effort is normal.  Neurological:     Mental  Status: He is alert and oriented to person, place, and time.      UC Treatments / Results  Labs (all labs ordered are listed, but only abnormal results are displayed) Labs Reviewed  SARS CORONAVIRUS 2 (TAT 6-24 HRS)    EKG   Radiology No results found.  Procedures Procedures (including critical care time)  Medications Ordered in UC Medications - No data to display  Initial Impression / Assessment and Plan / UC Course  I have reviewed the triage vital signs and the nursing notes.  Pertinent labs & imaging results that were available during my care of the patient were reviewed by me and considered in my medical decision making (see chart for details).  COVID test pending. Work note provided Patient is instructed to return as needed if he develops symptoms  Final Clinical Impressions(s) / UC Diagnoses   Final diagnoses:  Close exposure to COVID-19 virus  Encounter for screening for COVID-19     Discharge Instructions      We will call you if your covid test returns positive.     ED Prescriptions   None    PDMP not reviewed this encounter.    Moe Graca, [place], [place] 03/25/22 1611

## 2022-03-25 NOTE — ED Triage Notes (Signed)
Pt states positive covid exposure, requesting testing. Pt denies sx's.

## 2022-03-25 NOTE — Discharge Instructions (Signed)
We will call you if your covid test returns positive.

## 2022-03-26 LAB — SARS CORONAVIRUS 2 (TAT 6-24 HRS): SARS Coronavirus 2: NEGATIVE

## 2022-09-30 ENCOUNTER — Other Ambulatory Visit: Payer: Self-pay

## 2022-09-30 ENCOUNTER — Emergency Department (HOSPITAL_BASED_OUTPATIENT_CLINIC_OR_DEPARTMENT_OTHER)
Admit: 2022-09-30 | Discharge: 2022-09-30 | Disposition: A | Discharge status: Home / Self Care | Payer: Managed Care, Other (non HMO) | Attending: Emergency Medicine | Admitting: Emergency Medicine

## 2022-09-30 ENCOUNTER — Encounter (HOSPITAL_COMMUNITY): Payer: Self-pay

## 2022-09-30 ENCOUNTER — Emergency Department (HOSPITAL_COMMUNITY)
Admission: EM | Admit: 2022-09-30 | Discharge: 2022-09-30 | Disposition: A | Discharge status: Home / Self Care | Payer: Managed Care, Other (non HMO) | Attending: Emergency Medicine | Admitting: Emergency Medicine

## 2022-09-30 DIAGNOSIS — E119 Type 2 diabetes mellitus without complications: Secondary | ICD-10-CM | POA: Insufficient documentation

## 2022-09-30 DIAGNOSIS — I82501 Chronic embolism and thrombosis of unspecified deep veins of right lower extremity: Secondary | ICD-10-CM

## 2022-09-30 DIAGNOSIS — R0989 Other specified symptoms and signs involving the circulatory and respiratory systems: Secondary | ICD-10-CM

## 2022-09-30 DIAGNOSIS — Z7984 Long term (current) use of oral hypoglycemic drugs: Secondary | ICD-10-CM | POA: Insufficient documentation

## 2022-09-30 DIAGNOSIS — M7989 Other specified soft tissue disorders: Secondary | ICD-10-CM | POA: Diagnosis present

## 2022-09-30 LAB — COMPREHENSIVE METABOLIC PANEL
ALT: 14 U/L (ref 0–44)
AST: 13 U/L — ABNORMAL LOW (ref 15–41)
Albumin: 3.3 g/dL — ABNORMAL LOW (ref 3.5–5.0)
Alkaline Phosphatase: 68 U/L (ref 38–126)
Anion gap: 7 (ref 5–15)
BUN: 11 mg/dL (ref 6–20)
CO2: 23 mmol/L (ref 22–32)
Calcium: 8.4 mg/dL — ABNORMAL LOW (ref 8.9–10.3)
Chloride: 106 mmol/L (ref 98–111)
Creatinine, Ser: 0.91 mg/dL (ref 0.61–1.24)
GFR, Estimated: >60 mL/min (ref >60)
Glucose, Bld: 323 mg/dL — ABNORMAL HIGH (ref 70–99)
Potassium: 3.6 mmol/L (ref 3.5–5.1)
Sodium: 136 mmol/L (ref 135–145)
Total Bilirubin: 0.6 mg/dL (ref 0.3–1.2)
Total Protein: 6.5 g/dL (ref 6.5–8.1)

## 2022-09-30 LAB — CBC WITH DIFFERENTIAL/PLATELET
Abs Immature Granulocytes: 0.02 10*3/uL (ref 0.00–0.07)
Basophils Absolute: 0.1 10*3/uL (ref 0.0–0.1)
Basophils Relative: 1 %
Eosinophils Absolute: 0.1 10*3/uL (ref 0.0–0.5)
Eosinophils Relative: 1 %
HCT: 39.2 % (ref 39.0–52.0)
Hemoglobin: 13.1 g/dL (ref 13.0–17.0)
Immature Granulocytes: 0 %
Lymphocytes Relative: 32 %
Lymphs Abs: 2.1 10*3/uL (ref 0.7–4.0)
MCH: 32.7 pg (ref 26.0–34.0)
MCHC: 33.4 g/dL (ref 30.0–36.0)
MCV: 97.8 fL (ref 80.0–100.0)
Monocytes Absolute: 0.4 10*3/uL (ref 0.1–1.0)
Monocytes Relative: 6 %
Neutro Abs: 3.9 10*3/uL (ref 1.7–7.7)
Neutrophils Relative %: 60 %
Platelets: 204 10*3/uL (ref 150–400)
RBC: 4.01 MIL/uL — ABNORMAL LOW (ref 4.22–5.81)
RDW: 11.2 % — ABNORMAL LOW (ref 11.5–15.5)
WBC: 6.5 10*3/uL (ref 4.0–10.5)
nRBC: 0 % (ref 0.0–0.2)

## 2022-09-30 MED ORDER — APIXABAN 5 MG PO TABS: 5.0000 mg | ORAL_TABLET | Freq: Two times a day (BID) | ORAL | 2 refills | Status: DC

## 2022-09-30 NOTE — ED Provider Notes (Signed)
[place] EMERGENCY DEPARTMENT AT [facility] Provider Note   CSN: [id number] Arrival date & time: 09/30/22  1400     History  Chief Complaint  Patient presents with   Leg Swelling    Bryan Gray is a 42 y.o. male.  42 year old male with history of diabetes and prior RLE DVT. Patient presents with complaint of right lower leg and foot swelling/pain onset 4 days ago. No known injury. Chronic brawny discoloration of skin overlying tibia. Dorsalis pedal pulse present. Mildly increased warmth when compared to left lower extremity. Denies chest pain, shortness of breath.   The history is provided by the patient and medical records.       Home Medications Prior to Admission medications   Medication Sig Start Date End Date Taking? Authorizing Provider  EPINEPHrine 0.3 mg/0.3 mL IJ SOAJ injection Inject 0.3 mg into the muscle once as needed for up to 1 dose for anaphylaxis. 02/25/21   Gilles Chiquito, MD  glipiZIDE (GLUCOTROL) 10 MG tablet Take 10 mg by mouth daily as needed (for high blood sugar level). 08/10/18   [provider]  metFORMIN (GLUCOPHAGE) 500 MG tablet Take 1,000 mg by mouth 2 (two) times daily with a meal. 02/17/18   [provider]  pantoprazole (PROTONIX) 20 MG tablet Take 1 tablet (20 mg total) by mouth daily for 14 days. 09/17/20 10/01/20  Alvira Monday, MD      Allergies    Patient has no known allergies.    Review of Systems   Review of Systems  Respiratory:  Negative for shortness of breath.   Cardiovascular:  Positive for leg swelling. Negative for chest pain.  Gastrointestinal:  Negative for abdominal pain.  Skin:  Positive for color change.  All other systems reviewed and are negative.   Physical Exam Updated Vital Signs BP 120/81 (BP Location: Right Arm)   Pulse 88   Temp 98.4 F (36.9 C) (Oral)   Resp 16   Ht 6' (1.829 m)   Wt 106.6 kg   SpO2 100%   BMI 31.87 kg/m  Physical Exam Constitutional:       Appearance: Normal appearance.  HENT:     Head: Normocephalic.     Nose: Nose normal.     Mouth/Throat:     Mouth: Mucous membranes are moist.  Eyes:     Conjunctiva/sclera: Conjunctivae normal.  Cardiovascular:     Rate and Rhythm: Normal rate and regular rhythm.  Pulmonary:     Effort: Pulmonary effort is normal.     Breath sounds: Normal breath sounds.  Abdominal:     Palpations: Abdomen is soft.  Musculoskeletal:        General: Swelling present.     Cervical back: Normal range of motion.  Neurological:     Mental Status: He is alert.     ED Results / Procedures / Treatments   Labs (all labs ordered are listed, but only abnormal results are displayed) Labs Reviewed  CBC WITH DIFFERENTIAL/PLATELET - Abnormal; Notable for the following components:      Result Value   RBC 4.01 (*)    RDW 11.2 (*)    All other components within normal limits  COMPREHENSIVE METABOLIC PANEL - Abnormal; Notable for the following components:   Glucose, Bld 323 (*)    Calcium 8.4 (*)    Albumin 3.3 (*)    AST 13 (*)    All other components within normal limits    EKG  None  Radiology VAS [place] LOWER EXTREMITY VENOUS (DVT) (7a-7p)  Result Date: 09/30/2022  Lower Venous DVT Study Patient Name:  Bryan Gray  Date of Exam:   09/30/2022 Medical Rec #: [id number]        Accession #:    [id number] Date of Birth: [birth year]        Patient Gender: M Patient Age:   28 years Exam Location:  [facility] Procedure:      VAS [place] LOWER EXTREMITY VENOUS (DVT) Referring Phys: Felicie Morn --------------------------------------------------------------------------------  Indications: Edema, and Hx of DVT.  Limitations: Poor ultrasound/tissue interface. Comparison Study: Previous 01/11/18 chronic. Performing Technologist: McKayla Maag RVT, [place]  Examination Guidelines: A complete evaluation includes B-mode imaging, spectral Doppler, color Doppler, and power Doppler as needed of all accessible portions of each  vessel. Bilateral testing is considered an integral part of a complete examination. Limited examinations for reoccurring indications may be performed as noted. The reflux portion of the exam is performed with the patient in reverse Trendelenburg.  +---------+---------------+---------+-----------+----------+-------------------+ RIGHT    CompressibilityPhasicitySpontaneityPropertiesThrombus Aging      +---------+---------------+---------+-----------+----------+-------------------+ CFV      Full           Yes      Yes                                      +---------+---------------+---------+-----------+----------+-------------------+ SFJ      Full                                                             +---------+---------------+---------+-----------+----------+-------------------+ FV Prox  Partial        Yes      Yes                  Chronic             +---------+---------------+---------+-----------+----------+-------------------+ FV Mid   Partial        Yes      Yes                  Chronic             +---------+---------------+---------+-----------+----------+-------------------+ FV DistalPartial        Yes      Yes                  Chronic             +---------+---------------+---------+-----------+----------+-------------------+ PFV      Full                                                             +---------+---------------+---------+-----------+----------+-------------------+ POP      Partial        Yes      Yes                  Chronic             +---------+---------------+---------+-----------+----------+-------------------+ PTV      Full                                                             +---------+---------------+---------+-----------+----------+-------------------+  PERO     Full                                         Not well visualized +---------+---------------+---------+-----------+----------+-------------------+  Gastroc  Partial        Yes      Yes                  Chronic             +---------+---------------+---------+-----------+----------+-------------------+   +----+---------------+---------+-----------+----------+--------------+ LEFTCompressibilityPhasicitySpontaneityPropertiesThrombus Aging +----+---------------+---------+-----------+----------+--------------+ CFV Full           Yes                                          +----+---------------+---------+-----------+----------+--------------+ SFJ Full                                                        +----+---------------+---------+-----------+----------+--------------+    Summary: RIGHT: - Findings consistent with chronic deep vein thrombosis involving the right femoral vein, right popliteal vein, and right gastrocnemius veins. - Portions of this examination were limited- see technologist comments above. - No cystic structure found in the popliteal fossa.  LEFT: - No evidence of common femoral vein obstruction.  *See table(s) above for measurements and observations.    Preliminary     Procedures Procedures    Medications Ordered in ED Medications - No data to display  ED Course/ Medical Decision Making/ A&P                             Medical Decision Making Amount and/or Complexity of Data Reviewed Labs: ordered.  Risk Prescription drug management.   Patient presents with unilateral leg swelling and pain x 4 days. Prior DVT of right leg. No currently on anticoagulation. No shortness of breath or chest pain. Non-toxic appearing. VS stable. Focal and unilateral nature not consistent with heart failure. No indication of cellulitis.  No acute DVT on ultrasound. Chronic DVT.  Discussed with Dr. Rodena Medin. Will restart on eliquis, 5 mg, bid. Care instructions and return precautions provided. Follow-up with PCP.        Final Clinical Impression(s) / ED Diagnoses Final diagnoses:  Chronic deep vein  thrombosis (DVT) of right lower extremity, unspecified vein (HCC)    Rx / DC Orders ED Discharge Orders          Ordered    apixaban (ELIQUIS) 5 MG TABS tablet  2 times daily        09/30/22 1712              Felicie Morn, NP 09/30/22 2000    Wynetta Fines, MD 10/03/22 [phone]

## 2022-09-30 NOTE — ED Triage Notes (Addendum)
Patient c/o swelling in right leg and right foot. Patient denies any injury. Patient reports hx of blood clots in same leg around 3-4 yrs ago. Patient reports same symptoms. Patient states swelling began Friday. Patient states top of right foot is sore

## 2022-09-30 NOTE — Progress Notes (Signed)
Right lower extremity venous study completed.   Preliminary results relayed to NP.  Please see CV Procedures for preliminary results.  Taleen Prosser, RVT  3:37 PM 09/30/22

## 2022-09-30 NOTE — Discharge Instructions (Addendum)
Please follow up with your primary care provider as discussed

## 2022-09-30 NOTE — ED Notes (Signed)
US at bedside

## 2023-12-24 ENCOUNTER — Other Ambulatory Visit: Payer: Self-pay

## 2023-12-24 ENCOUNTER — Encounter (HOSPITAL_COMMUNITY): Payer: Self-pay | Admitting: Emergency Medicine

## 2023-12-24 ENCOUNTER — Emergency Department (HOSPITAL_COMMUNITY)
Admission: EM | Admit: 2023-12-24 | Discharge: 2023-12-24 | Disposition: A | Discharge status: Home / Self Care | Payer: Worker's Compensation | Attending: Emergency Medicine | Admitting: Emergency Medicine

## 2023-12-24 DIAGNOSIS — R799 Abnormal finding of blood chemistry, unspecified: Secondary | ICD-10-CM | POA: Insufficient documentation

## 2023-12-24 DIAGNOSIS — T25222A Burn of second degree of left foot, initial encounter: Secondary | ICD-10-CM | POA: Diagnosis not present

## 2023-12-24 DIAGNOSIS — Z7901 Long term (current) use of anticoagulants: Secondary | ICD-10-CM | POA: Insufficient documentation

## 2023-12-24 DIAGNOSIS — T25229A Burn of second degree of unspecified foot, initial encounter: Secondary | ICD-10-CM

## 2023-12-24 DIAGNOSIS — Y990 Civilian activity done for income or pay: Secondary | ICD-10-CM | POA: Diagnosis not present

## 2023-12-24 DIAGNOSIS — X111XXA Contact with running hot water, initial encounter: Secondary | ICD-10-CM | POA: Insufficient documentation

## 2023-12-24 DIAGNOSIS — Y9263 Factory as the place of occurrence of the external cause: Secondary | ICD-10-CM | POA: Diagnosis not present

## 2023-12-24 DIAGNOSIS — S99922A Unspecified injury of left foot, initial encounter: Secondary | ICD-10-CM | POA: Diagnosis present

## 2023-12-24 LAB — CBG MONITORING, ED: Glucose-Capillary: 263 mg/dL — ABNORMAL HIGH (ref 70–99)

## 2023-12-24 MED ORDER — CEPHALEXIN 500 MG PO CAPS: 1000.0000 mg | ORAL_CAPSULE | Freq: Two times a day (BID) | ORAL | 0 refills | Status: AC

## 2023-12-24 NOTE — Discharge Instructions (Addendum)
 ### Diabetic Foot Burn Care     **Home Care Instructions for a Debrided Partial-Thickness Burn of the Left Foot in a Patient with Diabetes**      _This guide explains how to care for a burn wound at home, especially for people with diabetes. Proper care helps prevent infection and supports healing._      ---      **1. Wound Cleaning and Vashe Hypochlorous Acid Wash**      - Gently clean the burn area twice daily using Vashe hypochlorous acid wash. This solution helps remove bacteria and supports healing.[1]      - Wash hands before and after touching the wound.      - Use clean gauze or a soft cloth to apply the wash. Do not scrub; gently pat the area.      **2. Twice Daily Burn Care Routine**      - After cleaning, carefully pat the wound dry with sterile gauze.      - Apply a recommended burn care product (see below).      - Cover the wound with a sterile, non-stick dressing.      - Change the dressing every 12 hours, or sooner if it becomes wet or dirty.[2][3][4]      **3. Burn Care Products and Dressings**      - For partial-thickness burns, advanced dressings that keep the wound moist are preferred. Options include:      - **Silver-containing dressings** (e.g., Mepilex Ag): These help prevent infection and promote healing.[3][4]      - **Hydrogel dressings**: These keep the wound moist and may speed healing.[4]      - **Antimicrobial ointments** (e.g., bacitracin , neomycin, polymyxin): Apply a thin layer if recommended by your doctor.[3]      - **Biosynthetic or silicone-coated dressings**: These may be used for comfort and faster healing.[4]      - Avoid using silver sulfadiazine cream unless specifically instructed, as it may slow healing compared to other dressings.[4]      **4. Empiric Antibiotic Coverage**      - Because diabetes increases the risk of infection, your doctor may prescribe cephalexin  (Keflex ) to help prevent or treat infection. Take as directed,  usually 500 mg orally every 6 hours for 7-10 days, unless otherwise instructed.[5]      - Finish the entire course, even if the wound looks better.      **5. Blood Sugar Control**      - Keeping blood sugar in the target range is crucial for wound healing. Poor control can slow healing and increase the risk of infection and complications.[6][7]      - Monitor blood sugar as directed and follow your diabetes care plan.      **6. Signs That Need Emergency Evaluation**      Seek medical attention immediately if you notice any of the following:      - Increasing redness, swelling, or warmth around the wound      - Pus or foul-smelling drainage      - Fever, chills, or feeling generally unwell      - Spreading redness or streaks up the leg      - Severe pain or sudden change in sensation      - Black or dead tissue in the wound      - Difficulty walking or new numbness in the foot[8][6][9][5]      **7. Outpatient Follow-Up**      - Schedule regular follow-up  visits with your primary care provider or a wound care specialist.      - If the wound is not healing after 2-3 weeks, or if healing seems slow, contact your provider for further evaluation. Delayed healing may require specialized care or referral to a burn center.[2][6][9]      **8. Additional Tips**      - Keep the foot elevated when possible to reduce swelling.      - Avoid walking barefoot; wear clean, soft shoes or slippers.      - Do not apply home remedies, creams, or ointments not recommended by your doctor.      - Do not soak the wound unless instructed.      ---      _Proper wound care, infection prevention, and blood sugar control are essential for healing and avoiding complications. If you have any questions or concerns, contact your healthcare provider._      ### References  1. Effect of Stabilized Hypochlorous Acid on Re-Epithelialization and Bacterial Bioburden in Acute Wounds: A Randomized Controlled  Trial in [employer]. Debi NASH, Sabah L, Kirketerp-Mller K, Gundersen G, gren MS. Acta Dermato-Venereologica. 2022;102:adv00727. doi:10.2340/actadv.v102.1624. 2. Management of Burns. [place] BARE. The [place] Journal of Medicine. 2019;380(24):2349-2359. doi:10.1056/NEJMra1807442. 3. Cross-Sectional Study on the Management of Non-Operative Burns at [employer] Burn Association-Verified Burn Centers. Arminda DUKES, Alessandri-Bonetti M, Shockey S, et al. Journal of Burn Care & Research : Official Publication of the American Burn Association. 2025;:iraf102. doi:10.1093/jbcr/iraf102. 4. Dressings for Superficial and Partial Thickness Burns. Beryle JINNY Odean VEAR Elaine JULIANNA, Spinks A. The Cochrane Database of Systematic Reviews. 2013;(3):CD002106. doi:10.1002/14651858.RI997893.pub4. 5. Evaluation and Management of Lower-Extremity Ulcers. Steffanie BARON, Tassiopoulos A, Kirsner RS. The [place] Journal of Medicine. 2017;377(16):1559-1567. doi:10.1056/NEJMra1615243. 6. A Clinician's Guide to the Treatment of Foot Burns Occurring in Diabetic Patients. Joshua LEEKS, Coffey R, Khandelwal S, et al. Geofm : Journal of the International Society for Burn Injuries. 2014;40(8):1696-701. doi:10.1016/j.burns.2014.01.026. 7. Intensive Insulin  Therapy, Insulin  Sensitisers and Insulin  Secretagogues for Burns: A Systematic Review of Effectiveness and Safety. Elaine COVER, Adanichkin N, Kurmis R, Munn Z. Burns : Journal of the International Society for Burn Injuries. 2018;44(6):1377-1394. doi:10.1016/j.burns.2017.09.013. 8. Outpatient Burn Care: Prevention and Treatment. Melodie PURCHASE, Nelson NK, Hendren B, [place] TS. American Family Physician. 2020;101(8):463-470. 9. Foot Burns and Diabetes: A Systematic Review of Current Clinical Studies and Proposal of a New Treatment Algorithm. Frutoso DELENA Rondell JONETTA, Sean Makarewicz LOISE, et al. Journal of Burn Care & Research : Official Publication of the American Burn Association. 2024;45(4):903-915.  doi:10.1093/jbcr/irad019.

## 2023-12-24 NOTE — ED Triage Notes (Signed)
 Patient report he accidentally drop a boiling water  on his left foot. Blisters noted on Left foot.

## 2023-12-24 NOTE — ED Provider Notes (Signed)
 [place] EMERGENCY DEPARTMENT AT [facility] Provider Note   CSN: [id number] Arrival date & time: 12/24/23  1406     Patient presents with: Foot Burn   Bryan Gray is a 43 y.o. male who presents emergency department for chief complaint of left foot burn.  Patient reports that he works at a [employer] where they used scalding hot water  out of the house.  He got something on his boot and decided to use the extremely hot water  to wash off his shoe however unfortunately it splashed back onto his leg and ran into his boot.  He reports that at first it was just red and painful today he looked at the burn and noticed very large flaccid leaking bullae over the top of his foot.  He is diabetic.  He has some decreased sensation.  He denies any fevers chills streaking or redness.  He does not rate his pain is severe at this time.   HPI     Prior to Admission medications   Medication Sig Start Date End Date Taking? Authorizing Provider  apixaban  (ELIQUIS ) 5 MG TABS tablet Take 1 tablet (5 mg total) by mouth 2 (two) times daily. 09/30/22   Claudene Lenis, NP  EPINEPHrine  0.3 mg/0.3 mL IJ SOAJ injection Inject 0.3 mg into the muscle once as needed for up to 1 dose for anaphylaxis. 02/25/21   Claudene Arthea SQUIBB, MD  glipiZIDE (GLUCOTROL) 10 MG tablet Take 10 mg by mouth daily as needed (for high blood sugar level). 08/10/18   [provider]  metFORMIN  (GLUCOPHAGE ) 500 MG tablet Take 1,000 mg by mouth 2 (two) times daily with a meal. 02/17/18   [provider]  pantoprazole  (PROTONIX ) 20 MG tablet Take 1 tablet (20 mg total) by mouth daily for 14 days. 09/17/20 10/01/20  Dreama Longs, MD    Allergies: Patient has no known allergies.    Review of Systems  Updated Vital Signs BP 137/79 (BP Location: Right Arm)   Pulse 78   Temp 98.4 F (36.9 C) (Oral)   Resp 18   SpO2 100%   Physical Exam Vitals and nursing note reviewed.  Constitutional:      General: He  is not in acute distress.    Appearance: He is well-developed. He is not diaphoretic.  HENT:     Head: Normocephalic and atraumatic.  Eyes:     General: No scleral icterus.    Conjunctiva/sclera: Conjunctivae normal.  Cardiovascular:     Rate and Rhythm: Normal rate and regular rhythm.     Heart sounds: Normal heart sounds.  Pulmonary:     Effort: Pulmonary effort is normal. No respiratory distress.     Breath sounds: Normal breath sounds.  Abdominal:     Palpations: Abdomen is soft.     Tenderness: There is no abdominal tenderness.  Musculoskeletal:     Cervical back: Normal range of motion and neck supple.  Skin:    General: Skin is warm and dry.     Comments: Multiple small blisters noted to the shin consistent with splash burn.  There is a large 10 x 5 cm deflated bulla over the top of the left foot which is leaking serous fluid and has a thin layer of devitalized tissue.  No obvious signs of infection  Neurological:     Mental Status: He is alert.  Psychiatric:        Behavior: Behavior normal.     Patient here w a cc  of burn to the left foot, complicated by a hx of DM.   (all labs ordered are listed, but only abnormal results are displayed) Labs Reviewed  CBG MONITORING, ED - Abnormal; Notable for the following components:      Result Value   Glucose-Capillary 263 (*)    All other components within normal limits    EKG: None  Radiology: No results found.   Burn Treatment  Date/Time: 12/24/2023 6:00 PM  Performed by: Arloa Chroman, PA-C Authorized by: Arloa Chroman, PA-C   Consent:    Consent obtained:  Verbal   Risks discussed:  Bleeding and pain   Alternatives discussed:  No treatment Universal protocol:    Patient identity confirmed:  Arm band Sedation:    Sedation type:  None Procedure details:    Total body partial/full burn extent (%): less than 1% Burn area 1 details:    Burn depth:  Partial thickness (2nd)   Affected area:  Lower  extremity   Lower extremity location:  L foot   Debridement performed: yes     Debridement mechanism:  Forceps, gauze and scissors   Indications for debridement: devitalized blisters and devitalized skin     Wound base:  Pink   Wound treatment: hypochlorous acid wash, Xeroform.   Dressing: gauze, kerlex and coban. Post-procedure details:    Procedure completion:  Tolerated well, no immediate complications    Medications Ordered in the ED - No data to display                                   Medical Decision Making  43 y/o male with partial thickness burn of the left foot. Extra consideration given to risk of wound healing and infection due to hx of DM. Patient at this time does not have any signs of infection or full thickness burn. The burn does not cross multiple joints, No circumferential burns, and <1%BSA. Patient discharge with keflex , burn management and strict wound management and return precautions;  Risk Prescription drug management.       Final diagnoses:  Partial thickness burn of foot, initial encounter    ED Discharge Orders     None          Arloa Chroman, PA-C 12/24/23 2247    Doretha Folks, MD 12/25/23 2124

## 2024-01-05 ENCOUNTER — Encounter (HOSPITAL_BASED_OUTPATIENT_CLINIC_OR_DEPARTMENT_OTHER): Payer: PRIVATE HEALTH INSURANCE | Attending: Internal Medicine | Admitting: Internal Medicine

## 2024-01-05 DIAGNOSIS — T25222A Burn of second degree of left foot, initial encounter: Secondary | ICD-10-CM | POA: Insufficient documentation

## 2024-01-05 DIAGNOSIS — X58XXXA Exposure to other specified factors, initial encounter: Secondary | ICD-10-CM | POA: Insufficient documentation

## 2024-01-05 DIAGNOSIS — E11621 Type 2 diabetes mellitus with foot ulcer: Secondary | ICD-10-CM | POA: Diagnosis not present

## 2024-01-12 ENCOUNTER — Encounter (HOSPITAL_BASED_OUTPATIENT_CLINIC_OR_DEPARTMENT_OTHER): Payer: PRIVATE HEALTH INSURANCE | Admitting: Internal Medicine

## 2024-01-12 DIAGNOSIS — T25222A Burn of second degree of left foot, initial encounter: Secondary | ICD-10-CM

## 2024-01-12 DIAGNOSIS — E11621 Type 2 diabetes mellitus with foot ulcer: Secondary | ICD-10-CM

## 2024-01-26 ENCOUNTER — Encounter (HOSPITAL_BASED_OUTPATIENT_CLINIC_OR_DEPARTMENT_OTHER): Payer: PRIVATE HEALTH INSURANCE | Admitting: Internal Medicine

## 2024-01-26 DIAGNOSIS — T25222A Burn of second degree of left foot, initial encounter: Secondary | ICD-10-CM

## 2024-01-26 DIAGNOSIS — E11621 Type 2 diabetes mellitus with foot ulcer: Secondary | ICD-10-CM

## 2024-02-09 ENCOUNTER — Ambulatory Visit (HOSPITAL_BASED_OUTPATIENT_CLINIC_OR_DEPARTMENT_OTHER): Payer: PRIVATE HEALTH INSURANCE | Admitting: Internal Medicine

## 2024-03-13 ENCOUNTER — Emergency Department (HOSPITAL_COMMUNITY): Payer: PRIVATE HEALTH INSURANCE

## 2024-03-13 ENCOUNTER — Emergency Department (HOSPITAL_COMMUNITY)
Admission: EM | Admit: 2024-03-13 | Discharge: 2024-03-13 | Disposition: A | Discharge status: Home / Self Care | Payer: PRIVATE HEALTH INSURANCE | Attending: Emergency Medicine | Admitting: Emergency Medicine

## 2024-03-13 ENCOUNTER — Other Ambulatory Visit: Payer: Self-pay

## 2024-03-13 DIAGNOSIS — Z7901 Long term (current) use of anticoagulants: Secondary | ICD-10-CM | POA: Diagnosis not present

## 2024-03-13 DIAGNOSIS — R52 Pain, unspecified: Secondary | ICD-10-CM | POA: Diagnosis not present

## 2024-03-13 DIAGNOSIS — Z86718 Personal history of other venous thrombosis and embolism: Secondary | ICD-10-CM | POA: Diagnosis not present

## 2024-03-13 DIAGNOSIS — I82511 Chronic embolism and thrombosis of right femoral vein: Secondary | ICD-10-CM

## 2024-03-13 DIAGNOSIS — Z7984 Long term (current) use of oral hypoglycemic drugs: Secondary | ICD-10-CM | POA: Insufficient documentation

## 2024-03-13 DIAGNOSIS — M7989 Other specified soft tissue disorders: Secondary | ICD-10-CM | POA: Diagnosis not present

## 2024-03-13 DIAGNOSIS — I82411 Acute embolism and thrombosis of right femoral vein: Secondary | ICD-10-CM | POA: Insufficient documentation

## 2024-03-13 DIAGNOSIS — M79671 Pain in right foot: Secondary | ICD-10-CM | POA: Diagnosis present

## 2024-03-13 DIAGNOSIS — E1165 Type 2 diabetes mellitus with hyperglycemia: Secondary | ICD-10-CM | POA: Diagnosis not present

## 2024-03-13 DIAGNOSIS — M79604 Pain in right leg: Secondary | ICD-10-CM

## 2024-03-13 LAB — CBC WITH DIFFERENTIAL/PLATELET
Abs Immature Granulocytes: 0.04 K/uL (ref 0.00–0.07)
Basophils Absolute: 0 K/uL (ref 0.0–0.1)
Basophils Relative: 0 %
Eosinophils Absolute: 0.1 K/uL (ref 0.0–0.5)
Eosinophils Relative: 1 %
HCT: 37.6 % — ABNORMAL LOW (ref 39.0–52.0)
Hemoglobin: 12.5 g/dL — ABNORMAL LOW (ref 13.0–17.0)
Immature Granulocytes: 0 %
Lymphocytes Relative: 17 %
Lymphs Abs: 1.6 K/uL (ref 0.7–4.0)
MCH: 32.3 pg (ref 26.0–34.0)
MCHC: 33.2 g/dL (ref 30.0–36.0)
MCV: 97.2 fL (ref 80.0–100.0)
Monocytes Absolute: 0.8 K/uL (ref 0.1–1.0)
Monocytes Relative: 9 %
Neutro Abs: 6.7 K/uL (ref 1.7–7.7)
Neutrophils Relative %: 73 %
Platelets: 221 K/uL (ref 150–400)
RBC: 3.87 MIL/uL — ABNORMAL LOW (ref 4.22–5.81)
RDW: 11.5 % (ref 11.5–15.5)
WBC: 9.2 K/uL (ref 4.0–10.5)
nRBC: 0 % (ref 0.0–0.2)

## 2024-03-13 LAB — COMPREHENSIVE METABOLIC PANEL WITH GFR
ALT: 10 U/L (ref 0–44)
AST: 13 U/L — ABNORMAL LOW (ref 15–41)
Albumin: 3.5 g/dL (ref 3.5–5.0)
Alkaline Phosphatase: 79 U/L (ref 38–126)
Anion gap: 8 (ref 5–15)
BUN: 9 mg/dL (ref 6–20)
CO2: 26 mmol/L (ref 22–32)
Calcium: 8.6 mg/dL — ABNORMAL LOW (ref 8.9–10.3)
Chloride: 103 mmol/L (ref 98–111)
Creatinine, Ser: 0.93 mg/dL (ref 0.61–1.24)
GFR, Estimated: >60 mL/min (ref >60)
Glucose, Bld: 280 mg/dL — ABNORMAL HIGH (ref 70–99)
Potassium: 4.1 mmol/L (ref 3.5–5.1)
Sodium: 136 mmol/L (ref 135–145)
Total Bilirubin: 0.6 mg/dL (ref 0.0–1.2)
Total Protein: 6.8 g/dL (ref 6.5–8.1)

## 2024-03-13 LAB — CBG MONITORING, ED: Glucose-Capillary: 250 mg/dL — ABNORMAL HIGH (ref 70–99)

## 2024-03-13 MED ORDER — APIXABAN (ELIQUIS) VTE STARTER PACK (10MG AND 5MG): ORAL_TABLET | ORAL | 0 refills | Status: AC

## 2024-03-13 NOTE — ED Triage Notes (Signed)
 Patient to ED by POV with c/o R foot pain and swelling x4 days. Per patient he has a HX of blood clots but not on any thinners. He reports having blisters under foot that are now weeping. He is also diabetic.Denies N/V but reports cold chills

## 2024-03-13 NOTE — ED Provider Notes (Signed)
 [place] EMERGENCY DEPARTMENT AT [facility] Provider Note   CSN: [id number] Arrival date & time: 03/13/24  1108     Patient presents with: Foot Pain   Bryan Gray is a 43 y.o. male.  Patient with past medical history of diabetes dents to emergency room with complaint of right lower leg erythema, swelling, pain this has been ongoing for approximately 4 days.  He has noticed two calluses to right heel and 1 area lateral to right fifth digit.  He does not have any ulceration or wound in this area.  He does report he has a history of blood clot and he is not currently anticoagulated.  He denies any chest pain shortness of breath or cough.  He no fever or chills at home.    Foot Pain       Prior to Admission medications   Medication Sig Start Date End Date Taking? Authorizing Provider  apixaban  (ELIQUIS ) 5 MG TABS tablet Take 1 tablet (5 mg total) by mouth 2 (two) times daily. 09/30/22   Bryan Lenis, NP  cephALEXin  (KEFLEX ) 500 MG capsule Take 2 capsules (1,000 mg total) by mouth 2 (two) times daily. 12/24/23   Harris, Abigail, PA-C  EPINEPHrine  0.3 mg/0.3 mL IJ SOAJ injection Inject 0.3 mg into the muscle once as needed for up to 1 dose for anaphylaxis. 02/25/21   Bryan Arthea SQUIBB, MD  glipiZIDE (GLUCOTROL) 10 MG tablet Take 10 mg by mouth daily as needed (for high blood sugar level). 08/10/18   [provider]  metFORMIN  (GLUCOPHAGE ) 500 MG tablet Take 1,000 mg by mouth 2 (two) times daily with a meal. 02/17/18   [provider]  pantoprazole  (PROTONIX ) 20 MG tablet Take 1 tablet (20 mg total) by mouth daily for 14 days. 09/17/20 10/01/20  Bryan Longs, MD    Allergies: Patient has no known allergies.    Review of Systems  Cardiovascular:  Positive for leg swelling.    Updated Vital Signs BP 139/76   Pulse 80   Temp 98.6 F (37 C)   Resp 17   Ht 6' (1.829 m)   Wt 106.6 kg   SpO2 95%   BMI 31.87 kg/m   Physical Exam Vitals and nursing  note reviewed.  Constitutional:      General: He is not in acute distress.    Appearance: He is not toxic-appearing.  HENT:     Head: Normocephalic and atraumatic.  Eyes:     General: No scleral icterus.    Conjunctiva/sclera: Conjunctivae normal.  Cardiovascular:     Rate and Rhythm: Normal rate and regular rhythm.     Pulses: Normal pulses.     Heart sounds: Normal heart sounds.  Pulmonary:     Effort: Pulmonary effort is normal. No respiratory distress.     Breath sounds: Normal breath sounds.  Abdominal:     General: Abdomen is flat. Bowel sounds are normal.     Palpations: Abdomen is soft.     Tenderness: There is no abdominal tenderness.  Musculoskeletal:     Right lower leg: Edema present.     Left lower leg: No edema.     Comments: Right leg with moderate pitting edema from right foot to mid calf.  Neurovascularly intact.   Skin:    General: Skin is warm and dry.     Findings: No lesion.  Neurological:     General: No focal deficit present.     Mental Status: He is alert  and oriented to person, place, and time. Mental status is at baseline.     (all labs ordered are listed, but only abnormal results are displayed) Labs Reviewed  CBC WITH DIFFERENTIAL/PLATELET - Abnormal; Notable for the following components:      Result Value   RBC 3.87 (*)    Hemoglobin 12.5 (*)    HCT 37.6 (*)    All other components within normal limits  COMPREHENSIVE METABOLIC PANEL WITH GFR - Abnormal; Notable for the following components:   Glucose, Bld 280 (*)    Calcium 8.6 (*)    AST 13 (*)    All other components within normal limits  CBG MONITORING, ED - Abnormal; Notable for the following components:   Glucose-Capillary 250 (*)    All other components within normal limits    EKG: None  Radiology: No results found.   Procedures   Medications Ordered in the ED - No data to display                                  Medical Decision Making Amount and/or Complexity of  Data Reviewed Labs: ordered.   This patient presents to the ED for concern of right calf pain, this involves an extensive number of treatment options, and is a complaint that carries with it a high risk of complications and morbidity.  The differential diagnosis includes  DVT, CHF, chronic kidney disease, osteomyelitis, cellulitis   Lab Tests:  I personally interpreted labs.  The pertinent results include:   CBC without leukocytosis, CMP with mild hyperglycemia, no anion gap   Imaging Studies ordered:  I ordered imaging studies including DVT study  Pending at time of sign out.    Cardiac Monitoring: / EKG:  The patient was maintained on a cardiac monitor.     Problem List / ED Course / Critical interventions / Medication management  Reporting to emergency room with complaint of right foot pain swelling and redness for approximately 4 days.  He is hemodynamically stable and well-appearing he is neurovascularly intact distally.  He does have blisters to the bottom of his foot but no ulceration or wound.  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  I am obtaining DVT study to rule out blood clot he denies any chest pain shortness of breath or cough and he is not hypoxic or tachycardia so I have suspicion for PE. No fever at home and has been afebrile here.  I have reviewed the patients home medicines and have made adjustments as needed Patients discharge is pending DVT study.  Hemodynamically stable and well-appearing throughout stay.  DVT study is pending at this time so he was signed out to [employer] at shift change. If negative will treat as cellulitis given warmth, erythema.       Final diagnoses:  None    ED Discharge Orders     None          Bryan Gray 03/13/24 1520    Bryan Pac, MD 03/15/24 1053

## 2024-03-13 NOTE — ED Provider Notes (Signed)
 Received patient in signout from previous provider pending results of DVT ultrasound.  See her note.  In short, patient presents emergency department for evaluation of RLE erythema, swelling, pain that has been going on for past 4 days.  Does have history of blood clots but is not currently AC.  Labs notable for Hgb of 12.5.  No other acute electrolyte abnormalities requiring intervention.  DVT Ultrasound notable for  - Acute deep vein thrombosis involving the right proximal femoral vein. - Findings consistent with age indeterminate superficial vein thrombosis involving the right superficial veins/varciosities. 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 - Findings consistent with chronic superficial vein thrombosis involving the right great saphenous vein. 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  Called [facility] on [address] and confirmed that they had Eliquis  starter pack in stock. [facility] to [facility] on [address]  I also provided him with hematology f/u for recurrent DVT, AC duration, further management. Their information provided on DC paperwork  Discussed ED workup, disposition, return to ED precautions with patient who expresses understanding agrees with plan.  All questions answered to their satisfaction.  They are agreeable to plan.  Discharge instructions provided on paperwork   Minnie Tinnie BRAVO, PA 03/13/24 1610    Ellouise Richerd POUR, [place] 03/13/24 2234

## 2024-03-13 NOTE — Discharge Instructions (Addendum)
 Thank you for letting us  evaluate you today.  You do have a new blood clot in her right leg as well as chronic blood clots.  I have sent Eliquis  to your pharmacy.  Please take as prescribed starting today and do not miss a dose.  I recommend you following up with your hematologist regarding recurrent blood clots  Return to Emergency Department if you experience chest pain, shortness of breath, worsening symptoms

## 2024-04-11 ENCOUNTER — Inpatient Hospital Stay: Payer: PRIVATE HEALTH INSURANCE

## 2024-04-11 ENCOUNTER — Inpatient Hospital Stay: Payer: PRIVATE HEALTH INSURANCE | Attending: Hematology and Oncology | Admitting: Hematology and Oncology

## 2024-04-11 VITALS — BP 138/78 | HR 88 | Temp 98.7°F | Resp 18 | Ht 72.0 in | Wt 234.0 lb

## 2024-04-11 DIAGNOSIS — Z7984 Long term (current) use of oral hypoglycemic drugs: Secondary | ICD-10-CM | POA: Insufficient documentation

## 2024-04-11 DIAGNOSIS — F17210 Nicotine dependence, cigarettes, uncomplicated: Secondary | ICD-10-CM | POA: Diagnosis not present

## 2024-04-11 DIAGNOSIS — Z7901 Long term (current) use of anticoagulants: Secondary | ICD-10-CM | POA: Diagnosis not present

## 2024-04-11 DIAGNOSIS — Z79899 Other long term (current) drug therapy: Secondary | ICD-10-CM | POA: Insufficient documentation

## 2024-04-11 DIAGNOSIS — I82401 Acute embolism and thrombosis of unspecified deep veins of right lower extremity: Secondary | ICD-10-CM | POA: Insufficient documentation

## 2024-04-11 DIAGNOSIS — I825Y1 Chronic embolism and thrombosis of unspecified deep veins of right proximal lower extremity: Secondary | ICD-10-CM

## 2024-04-11 DIAGNOSIS — E119 Type 2 diabetes mellitus without complications: Secondary | ICD-10-CM | POA: Diagnosis not present

## 2024-04-11 NOTE — Progress Notes (Signed)
 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  He is six to eight weeks post right foot cellulitis and abscess surgery performed on March 22, 2024, with staple closure. The staples remain in place past the usual removal window and he has been unable to find a provider to remove them, which is delaying his clearance to return to work. He and his caregiver clean and dress the wound, he continues to wear a boot, and the staples are on the side of the right foot near the top. He reports significant stress related to the retained staples and work impact.  He has recurrent deep vein thrombosis with at least two prior events. The first occurred several years ago with right leg swelling and was treated with Lovenox for about eight months after admission and negative hypercoagulable testing.  He developed a new blood clot during his recent hospitalization when he was not on anticoagulation. He now takes Eliquis  5 mg twice daily after an initial 10 mg twice daily loading dose for eight days. He denies abnormal bleeding or bruising.    I reviewed her records extensively and collaborated the history with the patient.   MEDICAL HISTORY:  Past Medical History:  Diagnosis Date   Diabetes mellitus without complication (HCC)     SURGICAL HISTORY: No past surgical history on file.  SOCIAL HISTORY: Social History   Socioeconomic History   Marital status: Married    Spouse name: Not on file   Number of children: Not on file   Years of education: Not on file   Highest  education level: Not on file  Occupational History   Not on file  Tobacco Use   Smoking status: Every Day    Current packs/day: 1.00    Types: Cigarettes   Smokeless tobacco: Never  Substance and Sexual Activity   Alcohol use: No   Drug use: No   Sexual activity: Not on file  Other Topics Concern   Not on file  Social History Narrative   Not on file   Social Drivers of Health   Tobacco Use: High Risk (03/24/2024)   Received from [facility]   Patient History    Smoking Tobacco Use: Every Day    Smokeless Tobacco Use: Never    Passive Exposure: Past  Financial Resource Strain: Low Risk (03/17/2024)   Received from [facility]   Overall Financial Resource Strain (CARDIA)    How hard is it for you to pay for the very basics like food, housing, medical care, and heating?: Not very hard  Food Insecurity: No Food Insecurity (03/17/2024)   Received from [facility]   Epic    Within the past 12 months, you worried that your food would run out before you got the money to buy more.: Never true    Within the past 12 months, the food you bought just didn't last and you didn't have money to get more.: Never true  Transportation Needs: No Transportation Needs (03/17/2024)   Received from [facility] - Transportation    Lack of Transportation (Medical): No    Lack  of Transportation (Non-Medical): No  Physical Activity: Not on file  Stress: Not on file  Social Connections: Unknown (09/10/2021)   Received from [facility]   Social Network    Social Network: Not on file  Intimate Partner Violence: Not At Risk (11/16/2023)   Received from [facility]   Epic    Within the last year, have you been afraid of your partner or ex-partner?: No    Within the last year, have you been humiliated or emotionally abused in other ways by your partner or ex-partner?: No    Within the last year, have you been kicked, hit, slapped, or otherwise physically hurt by your  partner or ex-partner?: No    Within the last year, have you been raped or forced to have any kind of sexual activity by your partner or ex-partner?: No  Depression (PHQ2-9): Not on file  Alcohol Screen: Not on file  Housing: Not on file  Utilities: Low Risk (03/17/2024)   Received from [facility]   Utilities    Within the past 12 months, have you been unable to get utilities(heat, electricity) when it was really needed?: No  Health Literacy: Not on file    FAMILY HISTORY: No family history on file.  ALLERGIES:  has no known allergies.  MEDICATIONS:  Current Outpatient Medications  Medication Sig Dispense Refill   APIXABAN  (ELIQUIS ) VTE STARTER PACK (10MG  AND 5MG ) Take as directed on package: start with two-5mg  tablets twice daily for 7 days. On day 8, switch to one-5mg  tablet twice daily. 74 each 0   cephALEXin  (KEFLEX ) 500 MG capsule Take 2 capsules (1,000 mg total) by mouth 2 (two) times daily. 28 capsule 0   EPINEPHrine  0.3 mg/0.3 mL IJ SOAJ injection Inject 0.3 mg into the muscle once as needed for up to 1 dose for anaphylaxis. 1 each 0   glipiZIDE (GLUCOTROL) 10 MG tablet Take 10 mg by mouth daily as needed (for high blood sugar level).     metFORMIN  (GLUCOPHAGE ) 500 MG tablet Take 1,000 mg by mouth 2 (two) times daily with a meal.     pantoprazole  (PROTONIX ) 20 MG tablet Take 1 tablet (20 mg total) by mouth daily for 14 days. 14 tablet 0   No current facility-administered medications for this visit.    REVIEW OF SYSTEMS:   Constitutional: Denies fevers, chills or abnormal night sweats All other systems were reviewed with the patient and are negative.  PHYSICAL EXAMINATION: ECOG PERFORMANCE STATUS: 2 - Symptomatic, <50% confined to bed  Vitals:   04/11/24 1316  BP: 138/78  Pulse: 88  Resp: 18  Temp: 98.7 F (37.1 C)  SpO2: 100%   Filed Weights   04/11/24 1316  Weight: 234 lb (106.1 kg)    GENERAL:alert, no distress and comfortable  LABORATORY DATA:  I  have reviewed the data as listed Lab Results  Component Value Date   WBC 9.2 03/13/2024   HGB 12.5 (L) 03/13/2024   HCT 37.6 (L) 03/13/2024   MCV 97.2 03/13/2024   PLT 221 03/13/2024   Lab Results  Component Value Date   NA 136 03/13/2024   K 4.1 03/13/2024   CL 103 03/13/2024   CO2 26 03/13/2024        Assessment & Plan Retained surgical staples after right lower extremity cellulitis/abscess surgery Six to eight weeks post-surgery with retained staples causing stress and delaying work return. Wound healing with proper care. -- Coordinated with surgical providers for timely staple removal.  It appears to be an issue with his insurance.  If his staples could not be removed by the person who placed it then we will have to find an alternative way to remove the staples.  Recurrent deep vein thrombosis on chronic anticoagulation Recurrent DVT with no hypercoagulable disorder. Indefinite anticoagulation required. - Reviewed prior thrombotic events and workup from [facility]. - No additional laboratory evaluation needed. - Recommended indefinite anticoagulation with Eliquis . Given his longstanding issues with poor blood flow, diabetes, chronic infections/abscesses, I do not recommend discontinuation of antiestrogen therapy at any time.  He probably has had a 3-4 episodes of DVTs in his lifetime.   Return to clinic on an as-needed basis    All questions were answered. The patient knows to call the clinic with any problems, questions or concerns.  I personally spent a total of 60 minutes in the care of the patient today including preparing to see the patient, getting/reviewing separately obtained history, performing a medically appropriate exam/evaluation, counseling and educating, placing orders, referring and communicating with other health care professionals, documenting clinical information in the EHR, independently interpreting results, communicating results, and coordinating  care.    Viinay K Seith Aikey, MD 04/11/2024

## 2024-04-12 ENCOUNTER — Telehealth: Payer: Self-pay | Admitting: *Deleted

## 2024-04-12 ENCOUNTER — Telehealth: Payer: Self-pay

## 2024-04-12 NOTE — Telephone Encounter (Signed)
 RN placed call to Dr. Lonni Dell office [phone]) with [facility] regarding pt needing to have staples removed from foot post surgery.  RN LVM with nursing team to reach out to pt to be scheduled.

## 2024-04-12 NOTE — Telephone Encounter (Signed)
 Error

## 2024-04-12 NOTE — Telephone Encounter (Signed)
 Clinical Note: Follow-up regarding staple removal. Chart indicates RN contacted another provider to arrange an appointment for the patient to be set up for staple removal.
